# Patient Record
Sex: Male | Born: 1960 | Race: White | Hispanic: No | Marital: Single | Smoking: Current every day smoker
Health system: Southern US, Community
[De-identification: ages and names within clinical notes are randomized; demographics above are authoritative.]

## PROBLEM LIST (undated history)

## (undated) DIAGNOSIS — F0390 Unspecified dementia without behavioral disturbance: Secondary | ICD-10-CM

## (undated) DIAGNOSIS — I639 Cerebral infarction, unspecified: Secondary | ICD-10-CM

## (undated) DIAGNOSIS — K7460 Unspecified cirrhosis of liver: Secondary | ICD-10-CM

## (undated) DIAGNOSIS — B1920 Unspecified viral hepatitis C without hepatic coma: Secondary | ICD-10-CM

## (undated) DIAGNOSIS — S069X9A Unspecified intracranial injury with loss of consciousness of unspecified duration, initial encounter: Secondary | ICD-10-CM

## (undated) DIAGNOSIS — I671 Cerebral aneurysm, nonruptured: Secondary | ICD-10-CM

## (undated) DIAGNOSIS — I219 Acute myocardial infarction, unspecified: Secondary | ICD-10-CM

## (undated) DIAGNOSIS — I509 Heart failure, unspecified: Secondary | ICD-10-CM

## (undated) HISTORY — DX: Acute myocardial infarction, unspecified: I21.9

---

## 2011-10-08 DIAGNOSIS — I639 Cerebral infarction, unspecified: Secondary | ICD-10-CM

## 2011-10-08 DIAGNOSIS — I219 Acute myocardial infarction, unspecified: Secondary | ICD-10-CM

## 2011-10-08 HISTORY — DX: Acute myocardial infarction, unspecified: I21.9

## 2011-10-08 HISTORY — DX: Cerebral infarction, unspecified: I63.9

## 2013-10-07 DIAGNOSIS — S069X9A Unspecified intracranial injury with loss of consciousness of unspecified duration, initial encounter: Secondary | ICD-10-CM

## 2013-10-07 DIAGNOSIS — S069XAA Unspecified intracranial injury with loss of consciousness status unknown, initial encounter: Secondary | ICD-10-CM

## 2013-10-07 HISTORY — DX: Unspecified intracranial injury with loss of consciousness of unspecified duration, initial encounter: S06.9X9A

## 2013-10-07 HISTORY — PX: OTHER SURGICAL HISTORY: SHX169

## 2013-10-07 HISTORY — DX: Unspecified intracranial injury with loss of consciousness status unknown, initial encounter: S06.9XAA

## 2016-01-05 ENCOUNTER — Emergency Department (HOSPITAL_COMMUNITY)
Admission: EM | Admit: 2016-01-05 | Discharge: 2016-01-05 | Disposition: A | Discharge status: Home / Self Care | Payer: Medicaid Other | Attending: Emergency Medicine | Admitting: Emergency Medicine

## 2016-01-05 ENCOUNTER — Emergency Department (HOSPITAL_COMMUNITY): Payer: Medicaid Other

## 2016-01-05 ENCOUNTER — Encounter (HOSPITAL_COMMUNITY): Payer: Self-pay | Admitting: Cardiology

## 2016-01-05 DIAGNOSIS — M952 Other acquired deformity of head: Secondary | ICD-10-CM | POA: Diagnosis not present

## 2016-01-05 DIAGNOSIS — I509 Heart failure, unspecified: Secondary | ICD-10-CM | POA: Diagnosis not present

## 2016-01-05 DIAGNOSIS — R945 Abnormal results of liver function studies: Secondary | ICD-10-CM

## 2016-01-05 DIAGNOSIS — Z79899 Other long term (current) drug therapy: Secondary | ICD-10-CM | POA: Diagnosis not present

## 2016-01-05 DIAGNOSIS — R7989 Other specified abnormal findings of blood chemistry: Secondary | ICD-10-CM | POA: Diagnosis present

## 2016-01-05 DIAGNOSIS — K7460 Unspecified cirrhosis of liver: Secondary | ICD-10-CM | POA: Diagnosis not present

## 2016-01-05 DIAGNOSIS — I639 Cerebral infarction, unspecified: Secondary | ICD-10-CM | POA: Insufficient documentation

## 2016-01-05 HISTORY — DX: Cerebral aneurysm, nonruptured: I67.1

## 2016-01-05 HISTORY — DX: Unspecified cirrhosis of liver: K74.60

## 2016-01-05 HISTORY — DX: Cerebral infarction, unspecified: I63.9

## 2016-01-05 HISTORY — DX: Heart failure, unspecified: I50.9

## 2016-01-05 HISTORY — DX: Unspecified intracranial injury with loss of consciousness of unspecified duration, initial encounter: S06.9X9A

## 2016-01-05 HISTORY — DX: Unspecified dementia, unspecified severity, without behavioral disturbance, psychotic disturbance, mood disturbance, and anxiety: F03.90

## 2016-01-05 HISTORY — DX: Unspecified viral hepatitis C without hepatic coma: B19.20

## 2016-01-05 LAB — CBC WITH DIFFERENTIAL/PLATELET
BASOS PCT: 0 %
Basophils Absolute: 0 10*3/uL (ref 0.0–0.1)
EOS ABS: 0.1 10*3/uL (ref 0.0–0.7)
Eosinophils Relative: 1 %
HEMATOCRIT: 38.6 % — AB (ref 39.0–52.0)
HEMOGLOBIN: 13.7 g/dL (ref 13.0–17.0)
LYMPHS ABS: 1 10*3/uL (ref 0.7–4.0)
Lymphocytes Relative: 29 %
MCH: 33.7 pg (ref 26.0–34.0)
MCHC: 35.5 g/dL (ref 30.0–36.0)
MCV: 94.8 fL (ref 78.0–100.0)
Monocytes Absolute: 0.5 10*3/uL (ref 0.1–1.0)
Monocytes Relative: 16 %
NEUTROS ABS: 1.8 10*3/uL (ref 1.7–7.7)
NEUTROS PCT: 52 %
Platelets: 56 10*3/uL — ABNORMAL LOW (ref 150–400)
RBC: 4.07 MIL/uL — AB (ref 4.22–5.81)
RDW: 14.6 % (ref 11.5–15.5)
WBC: 3.5 10*3/uL — AB (ref 4.0–10.5)

## 2016-01-05 LAB — COMPREHENSIVE METABOLIC PANEL
ALBUMIN: 2.7 g/dL — AB (ref 3.5–5.0)
ALK PHOS: 103 U/L (ref 38–126)
ALT: 34 U/L (ref 17–63)
AST: 76 U/L — ABNORMAL HIGH (ref 15–41)
Anion gap: 7 (ref 5–15)
BUN: 9 mg/dL (ref 6–20)
CALCIUM: 8.9 mg/dL (ref 8.9–10.3)
CO2: 27 mmol/L (ref 22–32)
CREATININE: 0.62 mg/dL (ref 0.61–1.24)
Chloride: 105 mmol/L (ref 101–111)
GFR calc Af Amer: >60 mL/min (ref >60)
GFR calc non Af Amer: >60 mL/min (ref >60)
GLUCOSE: 82 mg/dL (ref 65–99)
Potassium: 3.4 mmol/L — ABNORMAL LOW (ref 3.5–5.1)
SODIUM: 139 mmol/L (ref 135–145)
Total Bilirubin: 2.4 mg/dL — ABNORMAL HIGH (ref 0.3–1.2)
Total Protein: 6.5 g/dL (ref 6.5–8.1)

## 2016-01-05 LAB — I-STAT CG4 LACTIC ACID, ED: Lactic Acid, Venous: 1.52 mmol/L (ref 0.5–2.0)

## 2016-01-05 LAB — URINALYSIS, ROUTINE W REFLEX MICROSCOPIC
Bilirubin Urine: NEGATIVE
GLUCOSE, UA: NEGATIVE mg/dL
HGB URINE DIPSTICK: NEGATIVE
Ketones, ur: NEGATIVE mg/dL
Leukocytes, UA: NEGATIVE
Nitrite: NEGATIVE
PH: 8 (ref 5.0–8.0)
Protein, ur: NEGATIVE mg/dL
SPECIFIC GRAVITY, URINE: 1.005 (ref 1.005–1.030)

## 2016-01-05 LAB — TROPONIN I

## 2016-01-05 LAB — LIPASE, BLOOD: Lipase: 42 U/L (ref 11–51)

## 2016-01-05 LAB — AMMONIA: Ammonia: 45 umol/L — ABNORMAL HIGH (ref 9–35)

## 2016-01-05 MED ORDER — DIPHENHYDRAMINE HCL 50 MG/ML IJ SOLN
25.0000 mg | Freq: Once | INTRAMUSCULAR | Status: AC
  Administered 2016-01-05: 25 mg via INTRAVENOUS
  Filled 2016-01-05: qty 1

## 2016-01-05 MED ORDER — METOCLOPRAMIDE HCL 5 MG/ML IJ SOLN
10.0000 mg | Freq: Once | INTRAMUSCULAR | Status: AC
  Administered 2016-01-05: 10 mg via INTRAVENOUS
  Filled 2016-01-05: qty 2

## 2016-01-05 NOTE — Discharge Instructions (Signed)
Cirrhosis Your ammonia level is 45 which is minimally elevated. Follow with a stomach doctor and continue to take your liver medications. Return to the ED if you develop new or worsening symptoms. Cirrhosis is long-term (chronic) liver injury. The liver is your largest internal organ, and it performs many functions. The liver converts food into energy, removes toxic material from your blood, makes important proteins, and absorbs necessary vitamins from your diet. If you have cirrhosis, it means many of your healthy liver cells have been replaced by scar tissue. This prevents blood from flowing through your liver, which makes it difficult for your liver to function. This scarring is not reversible, but treatment can prevent it from getting worse.  CAUSES  Hepatitis C and long-term alcohol abuse are the most common causes of cirrhosis. Other causes include:  Nonalcoholic fatty liver disease.  Hepatitis B infection.  Autoimmune hepatitis.  Diseases that cause blockage of ducts inside the liver.  Inherited liver diseases.  Reactions to certain long-term medicines.  Parasitic infections.  Long-term exposure to certain toxins. RISK FACTORS You may have a higher risk of cirrhosis if you:  Have certain hepatitis viruses.  Abuse alcohol, especially if you are male.  Are overweight.  Share needles.  Have unprotected sex with someone who has hepatitis. SYMPTOMS  You may not have any signs and symptoms at first. Symptoms may not develop until the damage to your liver starts to get worse. Signs and symptoms of cirrhosis may include:   Tenderness in the right-upper part of your abdomen.  Weakness and tiredness (fatigue).  Loss of appetite.  Nausea.  Weight loss and muscle loss.  Itchiness.  Yellow skin and eyes (jaundice).  Buildup of fluid in the abdomen (ascites).  Swelling of the feet and ankles (edema).  Appearance of tiny blood vessels under the skin.  Mental  confusion.  Easy bruising and bleeding. DIAGNOSIS  Your health care provider may suspect cirrhosis based on your symptoms and medical history, especially if you have other medical conditions or a history of alcohol abuse. Your health care provider will do a physical exam to feel your liver and check for signs of cirrhosis. Your health care provider may perform other tests, including:   Blood tests to check:   Whether you have hepatitis B or C.   Kidney function.  Liver function.  Imaging tests such as:  MRI or CT scan to look for changes seen in advanced cirrhosis.  Ultrasound to see if normal liver tissue is being replaced by scar tissue.  A procedure using a long needle to take a sample of liver tissue (biopsy) for examination under a microscope. Liver biopsy can confirm the diagnosis of cirrhosis.  TREATMENT  Treatment depends on how damaged your liver is and what caused the damage. Treatment may include treating cirrhosis symptoms or treating the underlying causes of the condition to try to slow the progression of the damage. Treatment may include:  Making lifestyle changes, such as:   Eating a healthy diet.  Restricting salt intake.  Maintaining a healthy weight.   Not abusing drugs or alcohol.  Taking medicines to:  Treat liver infections or other infections.  Control itching.  Reduce fluid buildup.  Reduce certain blood toxins.  Reduce risk of bleeding from enlarged blood vessels in the stomach or esophagus (varices).  If varices are causing bleeding problems, you may need treatment with a procedure that ties up the vessels causing them to fall off (band ligation).  If cirrhosis is  causing your liver to fail, your health care provider may recommend a liver transplant.  Other treatments may be recommended depending on any complications of cirrhosis, such as liver-related kidney failure (hepatorenal syndrome). HOME CARE INSTRUCTIONS   Take medicines  only as directed by your health care provider. Do not use drugs that are toxic to your liver. Ask your health care provider before taking any new medicines, including over-the-counter medicines.   Rest as needed.  Eat a well-balanced diet. Ask your health care provider or dietitian for more information.   You may have to follow a low-salt diet or restrict your water intake as directed.  Do not drink alcohol. This is especially important if you are taking acetaminophen.  Keep all follow-up visits as directed by your health care provider. This is important. SEEK MEDICAL CARE IF:  You have fatigue or weakness that is getting worse.  You develop swelling of the hands, feet, legs, or face.  You have a fever.  You develop loss of appetite.  You have nausea or vomiting.  You develop jaundice.  You develop easy bruising or bleeding. SEEK IMMEDIATE MEDICAL CARE IF:  You vomit bright red blood or a material that looks like coffee grounds.  You have blood in your stools.  Your stools appear black and tarry.  You become confused.  You have chest pain or trouble breathing.   This information is not intended to replace advice given to you by your health care provider. Make sure you discuss any questions you have with your health care provider.   Document Released: 09/23/2005 Document Revised: 10/14/2014 Document Reviewed: 06/01/2014 Elsevier Interactive Patient Education [employer]

## 2016-01-05 NOTE — ED Provider Notes (Signed)
CSN: [id number]     Arrival date & time 01/05/16  1012 History  By signing my name below, I, Jared Frye, attest that this documentation has been prepared under the direction and in the presence of Jared OctaveStephen Malachi Suderman, MD. Electronically Signed: Marica OtterNusrat Frye, ED Scribe. 01/05/2016. 10:34 AM.  Chief Complaint  Patient presents with  . Abnormal Lab   HPI PCP: No primary care provider on file. HPI Comments: Jared FitchMichael Frye is a 55 y.o. 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 Pt does not know why his ammonia levels were being checked. Pt also complains of: (1) chronic left knee and left hip pain; and (2) two episodes of vomiting, one last night and one this morning. Pt denies any recent falls or head trauma. Pt denies fever, chills, abd pain, chest pain. Pt denies any chronic cardiac disease or chronic pulmonary problems.   Past Medical History  Diagnosis Date  . Stroke (HCC)   . Cerebral aneurysm   . CHF (congestive heart failure) (HCC)   . Traumatic brain injury (HCC)   . Hepatitis C   . Cirrhosis (HCC)   . Dementia    History reviewed. No pertinent past surgical history. History reviewed. No pertinent family history. Social History  Substance Use Topics  . Smoking status: None  . Smokeless tobacco: None  . Alcohol Use: None    Review of Systems A complete 10 system review of systems was obtained and all systems are negative except as noted in the HPI and PMH.   Allergies  Review of patient's allergies indicates no known allergies.  Home Medications   Prior to Admission medications   Medication Sig Start Date End Date Taking? Authorizing Provider  alclomethasone (ACLOVATE) 0.05 % cream Apply 1 application topically 2 (two) times daily.   Yes Historical Provider, MD  benztropine (COGENTIN) 1 MG tablet Take 1 mg by mouth 2 (two) times  daily.   Yes Historical Provider, MD  Calcium Carbonate-Vitamin D (CALCIUM-D) 600-400 MG-UNIT TABS Take 1 tablet by mouth 2 (two) times daily.   Yes Historical Provider, MD  carvedilol (COREG) 3.125 MG tablet Take 3.125 mg by mouth 2 (two) times daily with a meal.   Yes Historical Provider, MD  desonide (DESOWEN) 0.05 % lotion Apply 1 application topically 2 (two) times daily.   Yes Historical Provider, MD  donepezil (ARICEPT) 10 MG tablet Take 10 mg by mouth at bedtime.   Yes Historical Provider, MD  folic acid (FOLVITE) 1 MG tablet Take 1 mg by mouth daily.   Yes Historical Provider, MD  furosemide (LASIX) 20 MG tablet Take 20 mg by mouth 2 (two) times daily.   Yes Historical Provider, MD  guaifenesin (ROBITUSSIN) 100 MG/5ML syrup Take 200 mg by mouth every 6 (six) hours as needed for cough.   Yes Historical Provider, MD  ipratropium-albuterol (DUONEB) 0.5-2.5 (3) MG/3ML SOLN Take 3 mLs by nebulization every 6 (six) hours as needed (wheezing).   Yes Historical Provider, MD  lactulose (CHRONULAC) 10 GM/15ML solution Take 10 g by mouth 4 (four) times daily.   Yes Historical Provider, MD  lactulose, encephalopathy, (CHRONULAC) 10 GM/15ML SOLN Take 30 g by mouth 4 (four) times daily.   Yes Historical Provider, MD  Melatonin 3 MG TABS Take 1 tablet by mouth at bedtime.   Yes Historical Provider, MD  mirtazapine (REMERON) 30 MG tablet Take  30 mg by mouth at bedtime.   Yes Historical Provider, MD  Multiple Vitamin (MULTIVITAMIN) tablet Take 1 tablet by mouth daily.   Yes Historical Provider, MD  neomycin-bacitracin-polymyxin (NEOSPORIN) ointment Apply 1 application topically daily as needed for wound care. apply to eye   Yes Historical Provider, MD  Pancrelipase, Lip-Prot-Amyl, (CREON) 24000 units CPEP Take 1 capsule by mouth 3 (three) times daily.   Yes Historical Provider, MD  QUEtiapine (SEROQUEL) 25 MG tablet Take 25 mg by mouth 2 (two) times daily.   Yes Historical Provider, MD  selenium sulfide  (SELSUN) 1 % LOTN Apply 1 application topically 3 (three) times a week.   Yes Historical Provider, MD  spironolactone (ALDACTONE) 50 MG tablet Take 50 mg by mouth daily.   Yes Historical Provider, MD   Triage Vitals: BP 99/52 mmHg  Pulse 52  Temp(Src) 98.4 F (36.9 C) (Oral)  Resp 16  Ht 6\' 1"  (1.854 m)  Wt 214 lb (97.07 kg)  BMI 28.24 kg/m2  SpO2 98% Physical Exam  Constitutional: He appears well-developed and well-nourished. No distress.  HENT:  Head: Normocephalic and atraumatic.  Mouth/Throat: Oropharynx is clear and moist. Mucous membranes are dry. No oropharyngeal exudate.  Deformity to right scalp.   Eyes: Conjunctivae and EOM are normal. Pupils are equal, round, and reactive to light.  Neck: Normal range of motion. Neck supple.  No meningismus.  Cardiovascular: Normal rate, regular rhythm, normal heart sounds and intact distal pulses.   No murmur heard. Pulmonary/Chest: Effort normal and breath sounds normal. No respiratory distress.  Abdominal: Soft. There is no tenderness. There is no rebound and no guarding.  Musculoskeletal: Normal range of motion. He exhibits no edema or tenderness.  Neurological: He is alert. No cranial nerve deficit. He exhibits normal muscle tone. Coordination normal.  No ataxia on finger to nose bilaterally. No pronator drift. 4/5 strength ton left and 5/5 strength on right. CN 2-12 intact.Equal grip strength. Sensation intact. Oriented x2 to person and place. Negative asterixis.   Skin: Skin is warm.  Psychiatric: He has a normal mood and affect. His behavior is normal.  Nursing note and vitals reviewed.   ED Course  Procedures (including critical care time) DIAGNOSTIC STUDIES: Oxygen Saturation is 98% on ra, nl by my interpretation.    COORDINATION OF CARE: 10:33 AM: Discussed treatment plan which includes labs, head CT with pt at bedside; patient verbalizes understanding and agrees with treatment plan.  Labs Review Labs Reviewed  CBC  WITH DIFFERENTIAL/PLATELET - Abnormal; Notable for the following:    WBC 3.5 (*)    RBC 4.07 (*)    HCT 38.6 (*)    Platelets 56 (*)    All other components within normal limits  COMPREHENSIVE METABOLIC PANEL - Abnormal; Notable for the following:    Potassium 3.4 (*)    Albumin 2.7 (*)    AST 76 (*)    Total Bilirubin 2.4 (*)    All other components within normal limits  AMMONIA - Abnormal; Notable for the following:    Ammonia 45 (*)    All other components within normal limits  URINALYSIS, ROUTINE W REFLEX MICROSCOPIC (NOT AT [facility])  TROPONIN I  LIPASE, BLOOD  I-STAT CG4 LACTIC ACID, ED    Imaging Review Dg Chest 2 View  01/05/2016  CLINICAL DATA:  Altered mental status with chronic cough and shortness of breath EXAM: CHEST  2 VIEW COMPARISON:  None. FINDINGS: Lungs are clear. Heart size and pulmonary vascularity are normal.  No adenopathy. No bone lesions. No pneumothorax. IMPRESSION: No edema or consolidation. Electronically Signed   By: Bretta Bang III M.D.   On: 01/05/2016 11:14   Ct Head Wo Contrast  01/05/2016  CLINICAL DATA:  55 year old male with altered mental status. Cirrhosis with elevated ammonia levels. Personal history of traumatic brain injury. Initial encounter. EXAM: CT HEAD WITHOUT CONTRAST TECHNIQUE: Contiguous axial images were obtained from the base of the skull through the vertex without intravenous contrast. COMPARISON:  None. FINDINGS: Sequelae of right hemi craniectomy. No acute osseous abnormality identified. Visualized paranasal sinuses and mastoids are clear. No acute orbit or scalp soft tissue findings. Calcified atherosclerosis at the skull base. Right temporal lobe, frontal lobe (especially frontal operculum) and parietal lobe encephalomalacia. Probable areas of associated laminar necrosis. Mild ex vacuo ventricular enlargement. Wallerian degeneration evident at the midbrain on the right. No intracranial mass effect. Basilar cisterns are patent. No  acute intracranial hemorrhage identified. No definite acute cortically based infarct. Left hemisphere and posterior fossa gray-white matter differentiation is normal. No suspicious intracranial vascular hyperdensity. IMPRESSION: 1. No definite acute intracranial abnormality. 2. 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 Electronically Signed   By: Odessa Fleming M.D.   On: 01/05/2016 11:27   US Abdomen Limited Ruq  01/05/2016  CLINICAL DATA:  Elevated liver enzymes. History of hepatitis-C and hepatic cirrhosis EXAM: US ABDOMEN LIMITED - RIGHT UPPER QUADRANT COMPARISON:  None. FINDINGS: Gallbladder: Gallbladder is contracted with wall thickening. There are echogenic foci in the gallbladder which shadow consistent with gallstones. Largest gallstone seen measures 6 mm in length. No pericholecystic fluid. No sonographic Murphy sign noted by sonographer. Common bile duct: Diameter: 3 mm. No intrahepatic or extrahepatic biliary duct dilatation. Liver: No focal lesion identified. Liver has a nodular contour with increased coarsened and somewhat heterogeneous echotexture consistent with cirrhosis. IMPRESSION: Contracted gallbladder with wall thickening and cholelithiasis. A degree of cholecystitis is felt to be present. Liver appearance is consistent with cirrhosis. While no focal liver lesions are identified, it must be cautioned that the sensitivity of ultrasound for focal liver lesions is diminished significantly given these parenchymal liver disease changes. Electronically Signed   By: Bretta Bang III M.D.   On: 01/05/2016 13:14   I have personally reviewed and evaluated these imaging and lab results as part of my medical decision-making.   EKG Interpretation   Date/Time:  Friday January 05 2016 12:52:22 EDT Ventricular Rate:  55 PR Interval:  208 QRS Duration: 109 QT Interval:  486 QTC Calculation: 465 R Axis:   79 Text Interpretation:  Sinus rhythm Borderline  prolonged PR interval No  significant change was found Confirmed by Manus Gunning  MD, Jakwon Gayton [phone]) on  01/05/2016 12:59:52 PM      MDM   Final diagnoses:  Elevated LFTs  Hepatic cirrhosis, unspecified hepatic cirrhosis type [facility])   Patient sent from nursing home with ammonia level of 122. He denies any abdominal pain, nausea or vomiting. No fever. No chest pain or shortness of breath.  Ammonia level on arrival was 45.\  D/w [facility]. Staff states ammonia level was checked because in-house doctor ordered to be checked. Patient with history of cirrhosis and takes lactulose 3-4 times daily.  Labs show minimal bilirubin elevation of 2.7. Thrombocytopenia of 56. There is no evidence of ongoing bleeding. No records in our system. Care everywhere shows some records from [place] which show thrombocytopenia usually in the 60 range. His bilirubin is usually normal.  Ammonia level here  is 45 which is mildly above normal. Patient is fully awake and alert and oriented. He is tolerating by mouth and ambulatory. RUQ shows gallstones without definite cholecystitis.  Discussed with Dr. Lovell Sheehan. He agrees patient needs PCP and GI follow-up rather than urgent cholecystectomy. Unclear what his baseline liver function is. He has no abdominal pain, fever, leukocytosis and does not appear to have cholecystitis at this time.  He is tolerating PO and anxious to be discharged. Return precautions discussed.     I personally performed the services described in this documentation, which was scribed in my presence. The recorded information has been reviewed and is accurate.   Jared Octave, MD 01/05/16 [phone]

## 2016-01-05 NOTE — ED Notes (Signed)
Lab at bedside

## 2016-01-05 NOTE — ED Notes (Signed)
Patient has been ambulating to the restroom on his own.  Patient is able to walk without assistance.

## 2016-01-05 NOTE — ED Notes (Signed)
Pt given meal tray with approval from Dr. Manus Gunningancour.

## 2016-01-05 NOTE — ED Notes (Signed)
[facility] [name] [facility]. They are sending a ride for him.

## 2016-01-05 NOTE — ED Notes (Signed)
From [facility] with ammonia level 122.  Pt alert and oriented.

## 2016-01-05 NOTE — ED Notes (Signed)
Pt given fluids. Currently US is at bedside. Instructed to drink fluids when US complete.

## 2016-01-30 ENCOUNTER — Encounter: Payer: Self-pay | Admitting: Gastroenterology

## 2016-01-30 ENCOUNTER — Ambulatory Visit (INDEPENDENT_AMBULATORY_CARE_PROVIDER_SITE_OTHER): Payer: Medicaid Other | Admitting: Gastroenterology

## 2016-01-30 VITALS — BP 115/72 | HR 62 | Temp 97.5°F | Ht 72.0 in | Wt 218.0 lb

## 2016-01-30 DIAGNOSIS — B1920 Unspecified viral hepatitis C without hepatic coma: Secondary | ICD-10-CM | POA: Diagnosis not present

## 2016-01-30 DIAGNOSIS — K7460 Unspecified cirrhosis of liver: Secondary | ICD-10-CM
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
## 2016-01-30 NOTE — Progress Notes (Signed)
CC'ED TO PCP 

## 2016-01-30 NOTE — Patient Instructions (Signed)
1. Please have the lab work done. 2. Return to the office in 3 months or sooner if needed. 3. If you change your mind regarding having upper endoscopy to screen for esophageal varices and colonoscopy to screen for colon cancer, please let us know.

## 2016-01-30 NOTE — Assessment & Plan Note (Signed)
55 year old gentleman with cirrhosis complicated by possible recent encephalopathy, thrombocytopenia. Positive hepatitis C antibody, RNA unknown. Prior alcohol abuse, quit in 2015. Currently up-to-date on HCC screening. Update labs to calculate MELD and Child Pugh score. Patient is not interested in pursuing EGD for esophageal variceal screening. He understands that he could hemorrhage to death if he has varices that are not treated.  We will plan on labs today. Next ultrasound of the abdomen in 6 months. Come back to the office in 3 months for follow-up. If he changes his mind regarding EGD for esophageal variceal screening and colonoscopy for colon cancer prevention screening he will let us know. Otherwise continue current medication regimen.

## 2016-03-11 ENCOUNTER — Telehealth: Payer: Self-pay | Admitting: Gastroenterology

## 2016-03-11 NOTE — Telephone Encounter (Signed)
Reviewed labs done an outside source dated 02/08/2016  INR 1.47, HCV RNA 3,086,5781,016,675, HCV genotype 1A, white blood cell count 3140, hemoglobin 13.9, hematocrit 38.3, platelets 66,000, sodium 135.6, potassium 3.68, glucose 75.7, BUN 8.4, creatinine 0.73, total bilirubin 1.46, alkaline phosphatase 113.1, AST 76.7, ALT 39, albumin 2.76  MELD Na 15  Keep upcoming appointment. Patient will likely be referred out for HCV treatment due to decompensated cirrhosis. Will discuss patient's wishes at OV.

## 2016-03-14 NOTE — Telephone Encounter (Signed)
I sent to Dr. Darrick PennaFields inadvertently. [facility]

## 2016-03-21 NOTE — Telephone Encounter (Signed)
[facility] and [facility]

## 2016-04-30 ENCOUNTER — Ambulatory Visit: Payer: Medicaid Other | Admitting: Gastroenterology

## 2016-05-01 ENCOUNTER — Encounter: Payer: Self-pay | Admitting: Gastroenterology

## 2016-05-01 ENCOUNTER — Telehealth: Payer: Self-pay

## 2016-05-01 ENCOUNTER — Other Ambulatory Visit: Payer: Self-pay

## 2016-05-01 ENCOUNTER — Ambulatory Visit (INDEPENDENT_AMBULATORY_CARE_PROVIDER_SITE_OTHER): Payer: Medicaid Other | Admitting: Gastroenterology

## 2016-05-01 ENCOUNTER — Telehealth: Payer: Self-pay | Admitting: General Practice

## 2016-05-01 VITALS — BP 104/56 | HR 54 | Temp 97.0°F | Ht 72.0 in | Wt 218.8 lb

## 2016-05-01 DIAGNOSIS — K5900 Constipation, unspecified: Secondary | ICD-10-CM | POA: Insufficient documentation

## 2016-05-01 DIAGNOSIS — B182 Chronic viral hepatitis C: Secondary | ICD-10-CM | POA: Diagnosis not present

## 2016-05-01 DIAGNOSIS — I8500 Esophageal varices without bleeding: Secondary | ICD-10-CM

## 2016-05-01 DIAGNOSIS — K7460 Unspecified cirrhosis of liver: Secondary | ICD-10-CM

## 2016-05-01 MED ORDER — RIFAXIMIN 550 MG PO TABS: 550.0000 mg | ORAL_TABLET | Freq: Two times a day (BID) | ORAL | 11 refills | Status: AC

## 2016-05-01 MED ORDER — LINACLOTIDE 145 MCG PO CAPS: 145.0000 ug | ORAL_CAPSULE | Freq: Every day | ORAL | 11 refills | Status: DC
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
## 2016-05-01 NOTE — Patient Instructions (Addendum)
1. Start Linzess on empty stomach daily for constipation.  2. Start Xifaxan 550mg  twice daily to decrease the ammonia level from the cirrhosis. If your insurance will pay for this, this will take the place of your lactulose.  3. Discontinue lactulose, IF patient is able to start Xifaxan 500mg  twice a day per hepatic encephalopathy.  4. Upper endoscopy with Dr. Darrick Penna to look for esophageal varices. 5. Referral to the hepatitis C specialist in [facility] for consideration of treatment.  6. Return to the office in 3 months to see Dr. Darrick Penna. You will be due for labs and abdominal ultrasound at that time.

## 2016-05-01 NOTE — Telephone Encounter (Signed)
Nursing home called to change his EGD from 05/07/16 to 05/13/16.

## 2016-05-01 NOTE — Telephone Encounter (Signed)
I received a call from [place] with [facility] in regards to changing Mr Faupel' pre-admission appointment from 8/3 to 8/4. I left a message on Carolyn's phone in Endo regarding the change.

## 2016-05-02 ENCOUNTER — Encounter: Payer: Self-pay | Admitting: Gastroenterology

## 2016-05-02 NOTE — Assessment & Plan Note (Addendum)
Constipation. Noncompliance with lactulose given cramping, nausea. Switch to Linzess daily. Provide Xifaxan 550mg  BID for hepatic encephalopathy prevention. D/c lactulose.   Patient has never had a colonoscopy and refuses.

## 2016-05-02 NOTE — Assessment & Plan Note (Signed)
Currently up to date on hepatoma screening and labs. Will order hepatitis B vaccination given lack of immunity. Plan for EGD with deep sedation to screen for varices.  I have discussed the risks, alternatives, benefits with regards to but not limited to the risk of reaction to medication, bleeding, infection, perforation and the patient is agreeable to proceed. Written consent to be obtained.

## 2016-05-02 NOTE — Telephone Encounter (Signed)
I gave Jared Frye at [facility] Mr Slinker's new pre-op appointment 05/10/16 at 12:45 at [facility]

## 2016-05-02 NOTE — Assessment & Plan Note (Signed)
Would recommend referral for evaluation for HCV treatment at specialty clinic in [place] given complicated co-morbidities, decompensated disease. Patient in agreement. He is still in need of Hep B vaccination.

## 2016-05-03 ENCOUNTER — Other Ambulatory Visit (HOSPITAL_COMMUNITY): Payer: Medicaid Other

## 2016-05-03 NOTE — Progress Notes (Signed)
CC'ED TO PCP 

## 2016-05-07 NOTE — Patient Instructions (Signed)
Jared Frye  05/07/2016     @PREFPERIOPPHARMACY @   Your procedure is scheduled on 05/14/2016.  Report to [facility] at 6:15 A.M.  Call this number if you have problems the morning of surgery:  [phone]   Remember:  Do not eat food or drink liquids after midnight.  Take these medicines the morning of surgery with A SIP OF WATER Cogentin, Coreg, Aricept, Duoneb, Linzess, Oxy IR if needed, Seroquel, Xifaxan, Remeron   Do not wear jewelry, make-up or nail polish.  Do not wear lotions, powders, or perfumes.  You may wear deoderant.  Do not shave 48 hours prior to surgery.  Men may shave face and neck.  Do not bring valuables to the hospital.  [facility] is not responsible for any belongings or valuables.  Contacts, dentures or bridgework may not be worn into surgery.  Leave your suitcase in the car.  After surgery it may be brought to your room.  For patients admitted to the hospital, discharge time will be determined by your treatment team.  Patients discharged the day of surgery will not be allowed to drive home.    Please read over the following fact sheets that you were given. Anesthesia Post-op Instructions     PATIENT INSTRUCTIONS POST-ANESTHESIA  IMMEDIATELY FOLLOWING SURGERY:  Do not drive or operate machinery for the first twenty four hours after surgery.  Do not make any important decisions for twenty four hours after surgery or while taking narcotic pain medications or sedatives.  If you develop intractable nausea and vomiting or a severe headache please notify your doctor immediately.  FOLLOW-UP:  Please make an appointment with your surgeon as instructed. You do not need to follow up with anesthesia unless specifically instructed to do so.  WOUND CARE INSTRUCTIONS (if applicable):  Keep a dry clean dressing on the anesthesia/puncture wound site if there is drainage.  Once the wound has quit draining you may leave it open to air.  Generally you should leave the  bandage intact for twenty four hours unless there is drainage.  If the epidural site drains for more than 36-48 hours please call the anesthesia department.  QUESTIONS?:  Please feel free to call your physician or the hospital operator if you have any questions, and they will be happy to assist you.      Esophagogastroduodenoscopy Esophagogastroduodenoscopy (EGD) is a procedure that is used to examine the lining of the esophagus, stomach, and first part of the small intestine (duodenum). A long, flexible, lighted tube with a camera attached (endoscope) is inserted down the throat to view these organs. This procedure is done to detect problems or abnormalities, such as inflammation, bleeding, ulcers, or growths, in order to treat them. The procedure lasts 5-20 minutes. It is usually an outpatient procedure, but it may need to be performed in a hospital in emergency cases. LET [facility] CARE PROVIDER KNOW ABOUT:  Any allergies you have.  All medicines you are taking, including vitamins, herbs, eye drops, creams, and over-the-counter medicines.  Previous problems you or members of your family have had with the use of anesthetics.  Any blood disorders you have.  Previous surgeries you have had.  Medical conditions you have. RISKS AND COMPLICATIONS Generally, this is a safe procedure. However, problems can occur and include:  Infection.  Bleeding.  Tearing (perforation) of the esophagus, stomach, or duodenum.  Difficulty breathing or not being able to breathe.  Excessive sweating.  Spasms of the larynx.  Slowed heartbeat.  Low blood pressure. BEFORE THE PROCEDURE  Do not eat or drink anything after midnight on the night before the procedure or as directed by your health care provider.  Do not take your regular medicines before the procedure if your health care provider asks you not to. Ask your health care provider about changing or stopping those medicines.  If you wear  dentures, be prepared to remove them before the procedure.  Arrange for someone to drive you home after the procedure. PROCEDURE  A numbing medicine (local anesthetic) may be sprayed in your throat for comfort and to stop you from gagging or coughing.  You will have an IV tube inserted in a vein in your hand or arm. You will receive medicines and fluids through this tube.  You will be given a medicine to relax you (sedative).  A pain reliever will be given through the IV tube.  A mouth guard may be placed in your mouth to protect your teeth and to keep you from biting on the endoscope.  You will be asked to lie on your left side.  The endoscope will be inserted down your throat and into your esophagus, stomach, and duodenum.  Air will be put through the endoscope to allow your health care provider to clearly view the lining of your esophagus.  The lining of your esophagus, stomach, and duodenum will be examined. During the exam, your health care provider may:  Remove tissue to be examined under a microscope (biopsy) for inflammation, infection, or other medical problems.  Remove growths.  Remove objects (foreign bodies) that are stuck.  Treat any bleeding with medicines or other devices that stop tissues from bleeding (hot cautery, clipping devices).  Widen (dilate) or stretch narrowed areas of your esophagus and stomach.  The endoscope will be withdrawn. AFTER THE PROCEDURE  You will be taken to a recovery area for observation. Your blood pressure, heart rate, breathing rate, and blood oxygen level will be monitored often until the medicines you were given have worn off.  Do not eat or drink anything until the numbing medicine has worn off and your gag reflex has returned. You may choke.  Your health care provider should be able to discuss his or her findings with you. It will take longer to discuss the test results if any biopsies were taken.   This information is not  intended to replace advice given to you by your health care provider. Make sure you discuss any questions you have with your health care provider.   Document Released: 01/24/2005 Document Revised: 10/14/2014 Document Reviewed: 08/26/2012 Elsevier Interactive Patient Education [employer].

## 2016-05-09 ENCOUNTER — Other Ambulatory Visit (HOSPITAL_COMMUNITY): Payer: Medicaid Other

## 2016-05-10 ENCOUNTER — Encounter (HOSPITAL_COMMUNITY)
Admission: RE | Admit: 2016-05-10 | Discharge: 2016-05-10 | Disposition: A | Discharge status: Home / Self Care | Payer: Medicaid Other | Source: Ambulatory Visit | Attending: Gastroenterology | Admitting: Gastroenterology

## 2016-05-10 ENCOUNTER — Encounter (HOSPITAL_COMMUNITY): Payer: Self-pay

## 2016-05-10 DIAGNOSIS — Z01812 Encounter for preprocedural laboratory examination: Secondary | ICD-10-CM | POA: Diagnosis present

## 2016-05-10 LAB — BASIC METABOLIC PANEL WITH GFR
Anion gap: 5 (ref 5–15)
BUN: 10 mg/dL (ref 6–20)
CO2: 25 mmol/L (ref 22–32)
Calcium: 8.8 mg/dL — ABNORMAL LOW (ref 8.9–10.3)
Chloride: 106 mmol/L (ref 101–111)
Creatinine, Ser: 0.7 mg/dL (ref 0.61–1.24)
GFR calc Af Amer: >60 mL/min
GFR calc non Af Amer: >60 mL/min
Glucose, Bld: 107 mg/dL — ABNORMAL HIGH (ref 65–99)
Potassium: 3.9 mmol/L (ref 3.5–5.1)
Sodium: 136 mmol/L (ref 135–145)

## 2016-05-10 LAB — CBC
HCT: 39.9 % (ref 39.0–52.0)
Hemoglobin: 14 g/dL (ref 13.0–17.0)
MCH: 33.8 pg (ref 26.0–34.0)
MCHC: 35.1 g/dL (ref 30.0–36.0)
MCV: 96.4 fL (ref 78.0–100.0)
Platelets: 45 K/uL — ABNORMAL LOW (ref 150–400)
RBC: 4.14 MIL/uL — ABNORMAL LOW (ref 4.22–5.81)
RDW: 15.1 % (ref 11.5–15.5)
WBC: 4 K/uL (ref 4.0–10.5)

## 2016-05-13 ENCOUNTER — Telehealth: Payer: Self-pay

## 2016-05-13 NOTE — Telephone Encounter (Signed)
Family call this morning and said that they was not able to go to the hospital in morning due to transpiration but they wanted us to have there phone number. [phone](Mr[place]man [email or web address]. [place])  [phone])  [phone]. They would like for the father to be called after the EGD tommorrow

## 2016-05-14 ENCOUNTER — Ambulatory Visit (HOSPITAL_COMMUNITY): Payer: Medicaid Other | Admitting: Anesthesiology

## 2016-05-14 ENCOUNTER — Encounter (HOSPITAL_COMMUNITY)
Admission: RE | Disposition: A | Discharge status: Skilled Nursing Facility | Payer: Self-pay | Source: Ambulatory Visit | Attending: Gastroenterology

## 2016-05-14 ENCOUNTER — Ambulatory Visit (HOSPITAL_COMMUNITY)
Admission: RE | Admit: 2016-05-14 | Discharge: 2016-05-14 | Disposition: A | Discharge status: Skilled Nursing Facility | Payer: Medicaid Other | Source: Ambulatory Visit | Attending: Gastroenterology | Admitting: Gastroenterology

## 2016-05-14 DIAGNOSIS — K7460 Unspecified cirrhosis of liver: Secondary | ICD-10-CM | POA: Insufficient documentation

## 2016-05-14 DIAGNOSIS — I252 Old myocardial infarction: Secondary | ICD-10-CM | POA: Insufficient documentation

## 2016-05-14 DIAGNOSIS — I739 Peripheral vascular disease, unspecified: Secondary | ICD-10-CM | POA: Insufficient documentation

## 2016-05-14 DIAGNOSIS — Z7982 Long term (current) use of aspirin: Secondary | ICD-10-CM | POA: Diagnosis not present

## 2016-05-14 DIAGNOSIS — Z79899 Other long term (current) drug therapy: Secondary | ICD-10-CM | POA: Insufficient documentation

## 2016-05-14 DIAGNOSIS — I2510 Atherosclerotic heart disease of native coronary artery without angina pectoris: Secondary | ICD-10-CM | POA: Insufficient documentation

## 2016-05-14 DIAGNOSIS — F17200 Nicotine dependence, unspecified, uncomplicated: Secondary | ICD-10-CM | POA: Insufficient documentation

## 2016-05-14 DIAGNOSIS — K766 Portal hypertension: Secondary | ICD-10-CM | POA: Insufficient documentation

## 2016-05-14 DIAGNOSIS — K269 Duodenal ulcer, unspecified as acute or chronic, without hemorrhage or perforation: Secondary | ICD-10-CM | POA: Insufficient documentation

## 2016-05-14 DIAGNOSIS — I509 Heart failure, unspecified: Secondary | ICD-10-CM | POA: Diagnosis not present

## 2016-05-14 DIAGNOSIS — K759 Inflammatory liver disease, unspecified: Secondary | ICD-10-CM

## 2016-05-14 DIAGNOSIS — K2970 Gastritis, unspecified, without bleeding: Secondary | ICD-10-CM | POA: Diagnosis not present

## 2016-05-14 DIAGNOSIS — K2950 Unspecified chronic gastritis without bleeding: Secondary | ICD-10-CM | POA: Diagnosis not present

## 2016-05-14 DIAGNOSIS — B1920 Unspecified viral hepatitis C without hepatic coma: Secondary | ICD-10-CM | POA: Diagnosis not present

## 2016-05-14 DIAGNOSIS — I69354 Hemiplegia and hemiparesis following cerebral infarction affecting left non-dominant side: Secondary | ICD-10-CM | POA: Diagnosis not present

## 2016-05-14 DIAGNOSIS — K3189 Other diseases of stomach and duodenum: Secondary | ICD-10-CM | POA: Diagnosis not present

## 2016-05-14 DIAGNOSIS — F0390 Unspecified dementia without behavioral disturbance: Secondary | ICD-10-CM | POA: Insufficient documentation

## 2016-05-14 HISTORY — PX: BIOPSY: SHX5522

## 2016-05-14 HISTORY — PX: ESOPHAGOGASTRODUODENOSCOPY (EGD) WITH PROPOFOL: SHX5813

## 2016-05-14 LAB — COMPREHENSIVE METABOLIC PANEL
ALBUMIN: 2.5 g/dL — AB (ref 3.5–5.0)
ALK PHOS: 90 U/L (ref 38–126)
ALT: 29 U/L (ref 17–63)
ANION GAP: 5 (ref 5–15)
AST: 56 U/L — ABNORMAL HIGH (ref 15–41)
BUN: 9 mg/dL (ref 6–20)
CALCIUM: 7.9 mg/dL — AB (ref 8.9–10.3)
CHLORIDE: 103 mmol/L (ref 101–111)
CO2: 28 mmol/L (ref 22–32)
CREATININE: 0.71 mg/dL (ref 0.61–1.24)
GFR calc Af Amer: >60 mL/min (ref >60)
GFR calc non Af Amer: >60 mL/min (ref >60)
GLUCOSE: 84 mg/dL (ref 65–99)
Potassium: 4 mmol/L (ref 3.5–5.1)
SODIUM: 136 mmol/L (ref 135–145)
Total Bilirubin: 2.2 mg/dL — ABNORMAL HIGH (ref 0.3–1.2)
Total Protein: 5.7 g/dL — ABNORMAL LOW (ref 6.5–8.1)

## 2016-05-14 LAB — PROTIME-INR
INR: 1.46
Prothrombin Time: 17.8 seconds — ABNORMAL HIGH (ref 11.4–15.2)

## 2016-05-14 SURGERY — ESOPHAGOGASTRODUODENOSCOPY (EGD) WITH PROPOFOL
Anesthesia: Monitor Anesthesia Care

## 2016-05-14 MED ORDER — MIDAZOLAM HCL 2 MG/2ML IJ SOLN
1.0000 mg | INTRAMUSCULAR | Status: DC | PRN
  Administered 2016-05-14: 2 mg via INTRAVENOUS

## 2016-05-14 MED ORDER — ONDANSETRON HCL 4 MG/2ML IJ SOLN
4.0000 mg | Freq: Once | INTRAMUSCULAR | Status: AC
  Administered 2016-05-14: 4 mg via INTRAVENOUS

## 2016-05-14 MED ORDER — PROPOFOL 10 MG/ML IV BOLUS
INTRAVENOUS | Status: AC
  Filled 2016-05-14: qty 40

## 2016-05-14 MED ORDER — OMEPRAZOLE 20 MG PO CPDR: DELAYED_RELEASE_CAPSULE | ORAL | 11 refills | Status: AC

## 2016-05-14 MED ORDER — FENTANYL CITRATE (PF) 100 MCG/2ML IJ SOLN
25.0000 ug | INTRAMUSCULAR | Status: DC | PRN
  Administered 2016-05-14: 25 ug via INTRAVENOUS

## 2016-05-14 MED ORDER — ONDANSETRON HCL 4 MG/2ML IJ SOLN: 4.0000 mg | Freq: Once | INTRAMUSCULAR | Status: DC | PRN

## 2016-05-14 MED ORDER — LACTULOSE ENCEPHALOPATHY 10 GM/15ML PO SOLN: 30.0000 g | Freq: Four times a day (QID) | ORAL | 0 refills | Status: AC | PRN

## 2016-05-14 MED ORDER — PROPOFOL 500 MG/50ML IV EMUL
INTRAVENOUS | Status: DC | PRN
  Administered 2016-05-14: 100 ug/kg/min via INTRAVENOUS

## 2016-05-14 MED ORDER — MIDAZOLAM HCL 2 MG/2ML IJ SOLN
INTRAMUSCULAR | Status: AC
  Filled 2016-05-14: qty 2

## 2016-05-14 MED ORDER — FENTANYL CITRATE (PF) 100 MCG/2ML IJ SOLN
INTRAMUSCULAR | Status: AC
  Filled 2016-05-14: qty 2

## 2016-05-14 MED ORDER — FENTANYL CITRATE (PF) 100 MCG/2ML IJ SOLN: 25.0000 ug | INTRAMUSCULAR | Status: DC | PRN

## 2016-05-14 MED ORDER — STERILE WATER FOR IRRIGATION IR SOLN
Status: DC | PRN
  Administered 2016-05-14: 08:00:00

## 2016-05-14 MED ORDER — LIDOCAINE VISCOUS 2 % MT SOLN
OROMUCOSAL | Status: AC
  Filled 2016-05-14: qty 15

## 2016-05-14 MED ORDER — LACTATED RINGERS IV SOLN
INTRAVENOUS | Status: DC
  Administered 2016-05-14: 07:00:00 via INTRAVENOUS

## 2016-05-14 MED ORDER — LIDOCAINE VISCOUS 2 % MT SOLN
OROMUCOSAL | Status: DC | PRN
  Administered 2016-05-14: 6 mL via OROMUCOSAL

## 2016-05-14 MED ORDER — ONDANSETRON HCL 4 MG/2ML IJ SOLN
INTRAMUSCULAR | Status: AC
  Filled 2016-05-14: qty 2

## 2016-05-14 MED ORDER — MIDAZOLAM HCL 5 MG/5ML IJ SOLN
INTRAMUSCULAR | Status: DC | PRN
  Administered 2016-05-14: 2 mg via INTRAVENOUS

## 2016-05-14 NOTE — Brief Op Note (Signed)
05/14/2016  9:03 AM  PATIENT:  Jared Frye  55 y.o. male  PRE-OPERATIVE DIAGNOSIS:  SCREENING FOR VARICES  POST-OPERATIVE DIAGNOSIS:  portal hypertensive gastropathy, duodenal ulcer  PROCEDURE:  Procedure(s) with comments: ESOPHAGOGASTRODUODENOSCOPY (EGD) WITH PROPOFOL (N/A) - 815 - moved to 8/8 @ 7:30 BIOPSY - gastric bx's  SURGEON:  Surgeon(s) and Role:    * West BaliSandi L Leanore Biggers, MD - Primary  SEE AFTER VISIT SUMMARY FOR ADDITIONAL DETAILS AND RECOMMENDATIONS

## 2016-05-14 NOTE — Telephone Encounter (Signed)
CALLED PT'S FATHER. LVM TO CALL TO DISCUSS [phone].
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
## 2016-05-14 NOTE — Transfer of Care (Signed)
Immediate Anesthesia Transfer of Care Note  Patient: Jared Frye  Procedure(s) Performed: Procedure(s) with comments: ESOPHAGOGASTRODUODENOSCOPY (EGD) WITH PROPOFOL (N/A) - 815 - moved to 8/8 @ 7:30 BIOPSY - gastric bx's  Patient Location: PACU  Anesthesia Type:MAC  Level of Consciousness: sedated  Airway & Oxygen Therapy: Patient Spontanous Breathing and Patient connected to face mask oxygen  Post-op Assessment: Post -op Vital signs reviewed and stable  Post vital signs: Reviewed and stable  Last Vitals:  Vitals:   05/14/16 0715 05/14/16 0720  BP: 116/63   Resp: 16 16  Temp:      Last Pain:  Vitals:   05/14/16 0657  TempSrc: Oral  PainSc:       Patients Stated Pain Goal: 7 (05/14/16 0631)  Complications: No apparent anesthesia complications

## 2016-05-14 NOTE — Discharge Instructions (Signed)
YOU DO NOT HAVE ESOPHAGEAL VARICES. You have GASTRITIS AND SMALL ulcers IN THE FIRST PART OF YOUR SMALL INTESTINES. I biopsied your stomach.   NO ASPIRIN, BC/GOODY POWDERS, IBUPROFEN/MOTRIN, OR NAPROXEN/ALEVE DUE TO LIVER DISEASE AND LOW PLATELETS.  CONTINUE OMEPRAZOLE.  TAKE 30 MINUTES PRIOR TO YOUR MEALS TWICE DAILY FOR 3 MOS THEN ONCE DAILY FOREVER.  AVOID TRIGGERS FOR ulcers. SEE INFO BELOW.  FOLLOW UP IN DEC 2017.  REPEAT EGD 2-3 YEARS.  UPPER ENDOSCOPY AFTER CARE Read the instructions outlined below and refer to this sheet in the next week. These discharge instructions provide you with general information on caring for yourself after you leave the hospital. While your treatment has been planned according to the most current medical practices available, unavoidable complications occasionally occur. If you have any problems or questions after discharge, call DR. Jex Strausbaugh, [phone].  ACTIVITY  You may resume your regular activity, but move at a slower pace for the next 24 hours.   Take frequent rest periods for the next 24 hours.   Walking will help get rid of the air and reduce the bloated feeling in your belly (abdomen).   No driving for 24 hours (because of the medicine (anesthesia) used during the test).   You may shower.   Do not sign any important legal documents or operate any machinery for 24 hours (because of the anesthesia used during the test).    NUTRITION  Drink plenty of fluids.   You may resume your normal diet as instructed by your doctor.   Begin with a light meal and progress to your normal diet. Heavy or fried foods are harder to digest and may make you feel sick to your stomach (nauseated).   Avoid alcoholic beverages for 24 hours or as instructed.    MEDICATIONS  You may resume your normal medications.   WHAT YOU CAN EXPECT TODAY  Some feelings of bloating in the abdomen.   Passage of more gas than usual.    IF YOU HAD A BIOPSY TAKEN  DURING THE UPPER ENDOSCOPY:  Eat a soft diet IF YOU HAVE NAUSEA, BLOATING, ABDOMINAL PAIN, OR VOMITING.    FINDING OUT THE RESULTS OF YOUR TEST Not all test results are available during your visit. DR. Darrick PennaFIELDS WILL CALL YOU WITHIN 14 DAYS OF YOUR PROCEDUE WITH YOUR RESULTS. Do not assume everything is normal if you have not heard from DR. Bradleigh Sonnen, CALL HER OFFICE AT [phone].  SEEK IMMEDIATE MEDICAL ATTENTION AND CALL THE OFFICE: [phone] IF:  You have more than a spotting of blood in your stool.   Your belly is swollen (abdominal distention).   You are nauseated or vomiting.   You have a temperature over 101F.   You have abdominal pain or discomfort that is severe or gets worse throughout the day.   Ulcer Disease (Peptic Ulcer, Gastric Ulcer, Duodenal Ulcer) You have an ulcer. This may be in your stomach (gastric ulcer) or in the first part of your small bowel, the duodenum (duodenal ulcer). An ulcer is a break in the lining of the stomach or duodenum. The ulcer causes erosion into the deeper tissue.  CAUSES   The stomach has a lining to protect itself from the acid that digests food. The lining can be damaged in two main ways:  The Helico Pylori bacteria (H. Pyolori) can infect the lining of the stomach and cause ulcers.   ASPIRIN AND Nonsteroidal, anti-inflammatory medications (NSAIDS) can cause gastric ulcerations.   Smoking tobacco can increase  the acid in the stomach. This can lead to ulcers, and will impair healing of ulcers.   Other factors, such as alcohol use and stress may contribute to ulcer formation. Rarely, a tumor or cancer can cause an ulcer.   SYMPTOMS The problems (symptoms) of ulcer disease are usually a burning or gnawing of the mid-upper belly (abdomen). This is often worse on an empty stomach and may get better with food. This may be associated with feeling sick to your stomach (nausea), bloating, and vomiting. If the ulcer results in bleeding, it  can cause:  Black, tarry stools.   Vomiting of bright red blood.   Vomiting coffee-ground-looking materials.   SEVERE ANEMIA  HOME CARE INSTRUCTIONS Continue regular work and usual activities unless advised otherwise by your caregiver.  Avoid tobacco, alcohol, and caffeine. Tobacco use will decrease and slow the rates of healing.   Avoid foods that seem to aggravate or cause discomfort.   There are many over-the-counter products available to control stomach acid and other symptoms.   Special diets are not usually needed.   Keep any follow-up appointments and blood tests, as directed.

## 2016-05-14 NOTE — Anesthesia Postprocedure Evaluation (Signed)
Anesthesia Post Note  Patient: Jared FitchMichael Frye  Procedure(s) Performed: Procedure(s) (LRB): ESOPHAGOGASTRODUODENOSCOPY (EGD) WITH PROPOFOL (N/A) BIOPSY  Patient location during evaluation: PACU Anesthesia Type: MAC Level of consciousness: awake and patient cooperative Pain management: pain level controlled Vital Signs Assessment: post-procedure vital signs reviewed and stable Respiratory status: nonlabored ventilation, spontaneous breathing and respiratory function stable Cardiovascular status: blood pressure returned to baseline and stable Postop Assessment: no signs of nausea or vomiting Anesthetic complications: no    Last Vitals:  Vitals:   05/14/16 0720 05/14/16 0805  BP:    Resp: 16 16  Temp:      Last Pain:  Vitals:   05/14/16 0805  TempSrc:   PainSc: Asleep                 Bayley Hurn J

## 2016-05-14 NOTE — Op Note (Signed)
[facility] Patient Name: Jared Frye Procedure Date: 05/14/2016 7:13 AM MRN: [id number] Date of Birth: May 18, 1961 Attending MD: Jonette Eva , MD CSN: [id number] Age: 55 Admit Type: Outpatient Procedure:                Upper GI endoscopy WITH COLD FORCEPS BIOPSY Indications:              Cirrhosis/Hepatitis C-SCREEN FOR esophageal varices Providers:                Jonette Eva, MD, Edrick Kins, RN, Burke Keels,                            Technician Referring MD:              Medicines:                Propofol per Anesthesia Complications:            No immediate complications. Estimated Blood Loss:     Estimated blood loss was minimal. Procedure:                Pre-Anesthesia Assessment:                           - Prior to the procedure, a History and Physical                            was performed, and patient medications and                            allergies were reviewed. The patient's tolerance of                            previous anesthesia was also reviewed. The risks                            and benefits of the procedure and the sedation                            options and risks were discussed with the patient.                            All questions were answered, and informed consent                            was obtained. Prior Anticoagulants: The patient has                            taken aspirin, last dose was day of procedure. ASA                            Grade Assessment: II - A patient with mild systemic                            disease. After reviewing the risks and benefits,  the patient was deemed in satisfactory condition to                            undergo the procedure. After obtaining informed                            consent, the endoscope was passed under direct                            vision. Throughout the procedure, the patient's                            blood pressure, pulse, and oxygen  saturations were                            monitored continuously. The [id number] ([id number]                            scope was introduced through the mouth, and                            advanced to the second part of duodenum. The upper                            GI endoscopy was accomplished without difficulty.                            The patient tolerated the procedure well. Scope In: 7:47:08 AM Scope Out: 7:56:28 AM Total Procedure Duration: 0 hours 9 minutes 20 seconds  Findings:      The examined esophagus was normal.      Moderate portal hypertensive gastropathy was found in the cardia, in the       gastric fundus and in the gastric body.      Scattered mild inflammation was found in the gastric antrum. Biopsies       were taken with a cold forceps for Helicobacter pylori testing.      Few non-bleeding cratered duodenal ulcers with no stigmata of bleeding       were found in the duodenal bulb and in the first portion of the       duodenum. The largest lesion was 1 mm in largest dimension. Impression:               - Normal esophagus. NO ESOPHAGEAL, GASTRIC, OR                            DUODENAL VARICES                           - Portal hypertensive gastropathy.                           - Gastritis. Biopsied.                           - Multiple non-bleeding duodenal ulcers with no  stigmata of bleeding. Moderate Sedation:      Per Anesthesia Care Recommendation:           - Await pathology results.                           - Resume previous diet.                           - Continue present medications.                           - Return to my office in 6 months.                           - Repeat upper endoscopy in 2-3 years for                            surveillance.                           -CMP/PT/INR/AFP TODAY                           NO ASPIRIN, BC/GOODY POWDERS, IBUPROFEN/MOTRIN, OR                            NAPROXEN/ALEVE DUE TO  LIVER DISEASE AND LOW                            PLATELETS.                           START OMEPRAZOLE. TAKE 30 MINUTES PRIOR TO YOUR                            MEALS TWICE DAILY FOR 3 MOS THEN ONCE DAILY FOREVER.                           AVOID TRIGGERS FOR ulcers.                           FOLLOW UP IN DEC 2017.                           - Patient has a contact number available for                            emergencies. The signs and symptoms of potential                            delayed complications were discussed with the                            patient. Return to normal activities tomorrow.  Written discharge instructions were provided to the                            patient. Procedure Code(s):        --- Professional ---                           [phone], Esophagogastroduodenoscopy, flexible,                            transoral; with biopsy, single or multiple Diagnosis Code(s):        --- Professional ---                           K76.6, Portal hypertension                           K31.89, Other diseases of stomach and duodenum                           K29.70, Gastritis, unspecified, without bleeding                           K26.9, Duodenal ulcer, unspecified as acute or                            chronic, without hemorrhage or perforation                           K74.60, Unspecified cirrhosis of liver                           K75.9, Inflammatory liver disease, unspecified CPT copyright 2016 American Medical Association. All rights reserved. The codes documented in this report are preliminary and upon coder review may  be revised to meet current compliance requirements. Jonette Eva, MD Jonette Eva, MD 05/14/2016 9:47:59 AM This report has been signed electronically. Number of Addenda: 0

## 2016-05-14 NOTE — Anesthesia Preprocedure Evaluation (Signed)
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, Residual Symptoms    GI/Hepatic (+) Cirrhosis       , Hepatitis -, C  Endo/Other    Renal/GU      Musculoskeletal   Abdominal   Peds  Hematology  (+) Blood dyscrasia (thrombocytopenia), ,   Anesthesia Other Findings   Reproductive/Obstetrics                             Anesthesia Physical Anesthesia Plan  ASA: IV  Anesthesia Plan: MAC   Post-op Pain Management:    Induction: Intravenous  Airway Management Planned: Simple Face Mask  Additional Equipment:   Intra-op Plan:   Post-operative Plan:   Informed Consent: I have reviewed the patients History and Physical, chart, labs and discussed the procedure including the risks, benefits and alternatives for the proposed anesthesia with the patient or authorized representative who has indicated his/her understanding and acceptance.     Plan Discussed with:   Anesthesia Plan Comments:         Anesthesia Quick Evaluation

## 2016-05-14 NOTE — Telephone Encounter (Signed)
REVIEWED.  

## 2016-05-15 LAB — AFP TUMOR MARKER: AFP-Tumor Marker: 7.5 ng/mL (ref 0.0–8.3)

## 2016-05-16 ENCOUNTER — Telehealth: Payer: Self-pay | Admitting: Gastroenterology

## 2016-05-16 ENCOUNTER — Encounter: Payer: Self-pay | Admitting: Gastroenterology

## 2016-05-16 NOTE — Telephone Encounter (Signed)
APPT MADE AND LETTER SENT  °

## 2016-05-16 NOTE — Telephone Encounter (Signed)
I called and informed pt's med tech at [facility], [place]. I am also faxing the note to her for the records. ( fax # [phone]).

## 2016-05-16 NOTE — Telephone Encounter (Signed)
Please call pt. His stomach Bx shows gastritis.    NO ASPIRIN, BC/GOODY POWDERS, IBUPROFEN/MOTRIN, OR NAPROXEN/ALEVE DUE TO LIVER DISEASE AND LOW PLATELETS.  CONTINUE OMEPRAZOLE.  TAKE 30 MINUTES PRIOR TO YOUR MEALS TWICE DAILY FOR 3 MOS THEN ONCE DAILY FOREVER.  AVOID TRIGGERS FOR ulcers.   FOLLOW UP IN DEC 2017 E30 CIRRHOSIS.  REPEAT EGD 2 YEARS.

## 2016-05-22 ENCOUNTER — Encounter (HOSPITAL_COMMUNITY): Payer: Self-pay | Admitting: Gastroenterology

## 2016-06-06 ENCOUNTER — Encounter: Payer: Self-pay | Admitting: Gastroenterology

## 2016-06-06 ENCOUNTER — Other Ambulatory Visit: Payer: Self-pay

## 2016-06-06 ENCOUNTER — Telehealth: Payer: Self-pay | Admitting: Gastroenterology

## 2016-06-06 DIAGNOSIS — B1920 Unspecified viral hepatitis C without hepatic coma: Secondary | ICD-10-CM

## 2016-06-06 NOTE — Telephone Encounter (Signed)
Pt is due ultrasound in October

## 2016-06-06 NOTE — Telephone Encounter (Signed)
Order put in. Letter mailed.

## 2016-09-16 ENCOUNTER — Ambulatory Visit: Payer: Medicaid Other | Admitting: Gastroenterology

## 2016-09-20 ENCOUNTER — Ambulatory Visit: Payer: Medicaid Other | Admitting: Gastroenterology

## 2016-10-28 ENCOUNTER — Other Ambulatory Visit: Payer: Self-pay

## 2016-10-28 ENCOUNTER — Encounter: Payer: Self-pay | Admitting: Gastroenterology

## 2016-10-28 ENCOUNTER — Ambulatory Visit (INDEPENDENT_AMBULATORY_CARE_PROVIDER_SITE_OTHER): Payer: Medicaid Other | Admitting: Gastroenterology

## 2016-10-28 ENCOUNTER — Telehealth: Payer: Self-pay

## 2016-10-28 VITALS — BP 116/70 | HR 67 | Temp 97.6°F | Ht 72.0 in | Wt 223.0 lb

## 2016-10-28 DIAGNOSIS — B182 Chronic viral hepatitis C: Secondary | ICD-10-CM

## 2016-10-28 DIAGNOSIS — B1920 Unspecified viral hepatitis C without hepatic coma: Secondary | ICD-10-CM

## 2016-10-28 DIAGNOSIS — K7030 Alcoholic cirrhosis of liver without ascites: Secondary | ICD-10-CM | POA: Diagnosis not present

## 2016-10-28 NOTE — Progress Notes (Signed)
Called and spoke to [name] at the [facility]. She is checking on the Hep b vaccinations and will call back to let me know.
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
## 2016-10-28 NOTE — Telephone Encounter (Signed)
PA info submitted via [employer] website for Ultrasound Abd Complete. Additional clinical info also faxed. Case is pending.

## 2016-10-28 NOTE — Progress Notes (Signed)
Jared Frye, can we find out if patient ever had hepatitis B vaccinations as recommended?

## 2016-10-28 NOTE — Patient Instructions (Signed)
1. Please have your labs done today. 2. Abdominal ultrasound as scheduled.  3. Referral to the [facility] for Hepatitis C treatment 4. Return office visit in four months with Dr. Darrick PennaFields.

## 2016-10-28 NOTE — Progress Notes (Signed)
Call back from [place], pt has not had the hep b vaccinations. They will check with his PCP when he comes Thursday about getting it.

## 2016-10-28 NOTE — Assessment & Plan Note (Addendum)
Due for hepatoma screening, labs. It is unclear whether or not he actually received hepatitis B vaccinations as requested, we will see if there is any documentation available at group home. Today he seems somewhat somnolent. Unclear if this is medication effect versus hepatic encephalopathy. No obvious asterixis on exam. Patient is interested in pursuing hepatitis C treatment if possible. Due to multiple comorbidities would recommend [facility]. Patient in agreement. Referral to be made. Return to the office in 4 months with Dr. Darrick PennaFields.

## 2016-10-30 NOTE — Telephone Encounter (Signed)
Case is still pending on [employer] website. Refaxed clinical info.

## 2016-10-31 NOTE — Progress Notes (Signed)
CC'D TO PCP °

## 2016-10-31 NOTE — Progress Notes (Signed)
T/C from [place] at [facility]. She said they will check with the doctor today and find out if pt has ever had Hep B vaccinations. She said he was referred here for Hepatitis. I told her we just need to find out if he has ever had the Hep B series. She thought that we give those shots and is now aware that we do not.

## 2016-10-31 NOTE — Telephone Encounter (Signed)
Case for Ultrasound Abdomen Complete was approved. Auth# [id number]. Auth end date: 11/27/16.

## 2016-11-04 NOTE — Progress Notes (Signed)
Thanks Doris, correspondence noted.  We also need to know whether he had his labs done that were ordered 10/28/2016. If done at the home, we need a copy.

## 2016-11-05 ENCOUNTER — Other Ambulatory Visit (HOSPITAL_COMMUNITY): Payer: Medicaid Other

## 2016-11-05 NOTE — Progress Notes (Signed)
I spoke to [facility] at the [facility] and she is faxing the results to us.

## 2016-11-06 NOTE — Progress Notes (Signed)
Lupita LeashDonna faxed me just the orders that we had sent. I called her back and told her we need them to do the labs and fax the results. She said they have labs drawn on Wednesdays and the person has left today. It will probably be next week now, but they will have them drawn and fax results to us.

## 2016-11-07 ENCOUNTER — Ambulatory Visit (HOSPITAL_COMMUNITY)
Admission: RE | Admit: 2016-11-07 | Discharge: 2016-11-07 | Disposition: A | Discharge status: Home / Self Care | Payer: Medicaid Other | Source: Ambulatory Visit | Attending: Gastroenterology | Admitting: Gastroenterology

## 2016-11-07 DIAGNOSIS — B182 Chronic viral hepatitis C: Secondary | ICD-10-CM

## 2016-11-07 DIAGNOSIS — K8020 Calculus of gallbladder without cholecystitis without obstruction: Secondary | ICD-10-CM | POA: Diagnosis not present

## 2016-11-07 DIAGNOSIS — K7030 Alcoholic cirrhosis of liver without ascites: Secondary | ICD-10-CM | POA: Insufficient documentation

## 2016-11-07 DIAGNOSIS — K829 Disease of gallbladder, unspecified: Secondary | ICD-10-CM | POA: Insufficient documentation

## 2016-11-14 ENCOUNTER — Telehealth: Payer: Self-pay

## 2016-11-14 NOTE — Telephone Encounter (Signed)
Received fax from [facility]. Pt has appt 11/22/16 at 11:00am.

## 2016-11-17 NOTE — Progress Notes (Signed)
Gallstone and thickened gallbladder wall (stable over one year). No clinically evidence of cholecystitis. ?secondary to adjacent hepatocellular disease. Cirrhosis but no hepatoma.   Plan for ruq u/s in 6 months for hepatoma screening.

## 2016-11-18 NOTE — Progress Notes (Signed)
ON RECALL  °

## 2016-11-18 NOTE — Progress Notes (Signed)
Laurelyn SickleKatina at the [facility] is aware. I am faxing this info to her at [phone]. Forwarding to [place] to nic the [place] in 6 months.

## 2016-11-20 ENCOUNTER — Telehealth: Payer: Self-pay

## 2016-11-20 NOTE — Telephone Encounter (Signed)
Received labs dated 11/20/2016 Glucose 150, BUN 10.4, creatinine 0.64, sodium 137, potassium 3.5, total bilirubin 1.7, alkaline phosphatase 245, AST 47, ALT 16, albumin 3, white blood cell count 3500, hemoglobin 15.7, hematocrit 44.1, platelets 59,000, INR 1.5, ammonia 127 (normal 22-87).   MELD 13.   Doris,   For elevated ammonia level, please make sure patient is taking Xifaxan 550mg  BID, lactulose 30cc qid (needs to have 3 soft BMs daily), if sleepy, difficult to arouse then he should go to ER.   Please remind them that he has an appointment with [facility] 11/22/16 at 11am.

## 2016-11-20 NOTE — Telephone Encounter (Signed)
T/C from [place] at [facility], reporting the pt has an ammonia level of 129 and they were asked to call us and be advised.  They will be faxing the lab results to us and monitoring the pt at this time, as he is doing fine and just went out to smoke a cigarette.   Routing to Tana CoastLeslie Lewis, PA to advise!

## 2016-11-20 NOTE — Telephone Encounter (Signed)
noted 

## 2016-11-20 NOTE — Telephone Encounter (Signed)
Lab orders received via fax and placed on Leslie's desk.

## 2016-11-20 NOTE — Telephone Encounter (Signed)
I called and spoke with Jared Frye again. She said pt is taking the meds as ordered and they are aware to take him to ED if he is hard to arouse. I am faxing this info to her at [phone].  They are also aware of the appt on 11/22/2016.

## 2016-11-25 NOTE — Telephone Encounter (Signed)
Noted  

## 2016-11-25 NOTE — Telephone Encounter (Signed)
T/C from [place] at the [facility], pt is very confused today and they are sending [place] to the hospital in [place].

## 2016-12-24 ENCOUNTER — Telehealth: Payer: Self-pay

## 2016-12-24 NOTE — Telephone Encounter (Signed)
Lupita LeashDonna from [facility] called to let us know that his insurance will not cover the HBV shot. They are wanting to know if we would like to order it another way of what to do? Please advise

## 2016-12-25 NOTE — Telephone Encounter (Signed)
I have printed the [place] Medicaid requirements and pt them on LSL desk.

## 2016-12-25 NOTE — Telephone Encounter (Signed)
Jared FanningJulie, I'm not sure if we can do anything different to get it covered. Only thing I can think of is making sure dx of cirrhosis was used.

## 2016-12-26 NOTE — Telephone Encounter (Signed)
[place] Medicaid requirements placed on my desk are for Children.  Do they have approved requirements for adults with cirrhosis?

## 2016-12-27 NOTE — Telephone Encounter (Signed)
Paperwork has children at the top of the page but under hep B it states "adult use". I have this highlighted and placed on your desk.

## 2017-01-01 NOTE — Telephone Encounter (Signed)
Routing to Fortune BrandsDoris. SLF pt.

## 2017-01-01 NOTE — Telephone Encounter (Signed)
I called and spoke to [place] at the [facility]. She will check and see if pt is able to afford and she will call me back.

## 2017-01-01 NOTE — Telephone Encounter (Signed)
Reviewed [place] Medicaid requirements and based on their guidelines the patient does not qualify for assistance with vaccinations.   So if patient is unable to afford Hep B vaccinations, just document that.   [facility] also seeing [facility] and has follow up with them this month. Will continue to follow those records.

## 2017-01-06 NOTE — Telephone Encounter (Signed)
I called and spoke to Jared Frye and she said that pt CANNOT AFFORD the Hep B vaccinations.

## 2017-01-08 ENCOUNTER — Encounter: Payer: Self-pay | Admitting: Gastroenterology

## 2017-01-08 NOTE — Telephone Encounter (Signed)
Noted  

## 2017-01-22 ENCOUNTER — Telehealth: Payer: Self-pay | Admitting: Gastroenterology

## 2017-01-22 NOTE — Telephone Encounter (Signed)
I called [facility] and spoke with [facility] and made her aware of LLs recommendations

## 2017-01-22 NOTE — Telephone Encounter (Signed)
I called [facility] and spoke with the med tech(Catina) and she stated his ammonia level was 158 and this was ordered by Dr. Adriana Simas and he's going to change his Xifaxan to three times a day for 7 days.    The patient is still taking his Lactulose.  Dr. Elesa Massed wanted to know if we had any further recommendations in regards tho the elevated ammonia level.  Routing to [employer].

## 2017-01-22 NOTE — Telephone Encounter (Signed)
If patient is having drowsiness or confusion then would continue Xifaxan  BID, increase the lactulose until patient having 3 soft bowel movements daily. According to our records he takes lactulose 30cc four times daily. Can increase to 45cc four times daily. ALSO MAKE SURE NO ELECTROLYTE ABNORMALITIES OR INFECTION OR EVIDENCE OF OVERT GI BLEEDING.  If patient IS NOT DROWSY OR CONFUSED, I would not treat an elevated ammonia level as cirrhotics may not have NORMAL levels, would continue with his current regimen.

## 2017-01-22 NOTE — Telephone Encounter (Signed)
[facility] called to say that patient's ammoni level was 158 and needed to let the nurse be aware. Their number is [phone]

## 2017-01-22 NOTE — Telephone Encounter (Signed)
I also faxed the recommendations to [facility] at [phone].

## 2017-01-27 NOTE — Telephone Encounter (Signed)
Jared Frye, please see fax from [facility]. Messages dated 01/23/2017.   Dr. Molli Hazard Ward increased the Xifaxin 550 mg from bid to tid.   Also, they noted that pt has been on 45 ml Lactulose qid since 11/01/2016.  Please advise!

## 2017-01-28 NOTE — Telephone Encounter (Signed)
Received a fax from [facility] 01/24/2017 at 140PM.  Patient is dosed at lactulose 45cc (30G) QID since 10/2016.    Recommendations as previously noted on 01/22/17. IF PATIENT IS NOT CONFUSED OR DROWSY, no change in therapy is needed.  Please find out: #1 is the patient confused or drowsy #2 How many times daily is patient actually taking the lactulose, previously patient told me he did not take it regularly as prescribed. #3 how many bowel movements daily.

## 2017-01-29 NOTE — Telephone Encounter (Signed)
Would continue Xifaxan  BID. Continue lactulose 45cc qid to have 3-4 soft BMs daily. Would not recommend checking ammonia levels unless they are concerned that he is encephalopathic.

## 2017-01-29 NOTE — Telephone Encounter (Signed)
I spoke to pt's nurse, Laurelyn Sickle. She said pt is NOT having any drowsiness or confusion at this time. He IS taking Lactulose 45 cc's qid everyday. She said they have to document if he refuses, and he has not been refusing. Also, she said the pt said he is having 4 BM's daily.

## 2017-01-30 NOTE — Telephone Encounter (Signed)
Called and Doran Clay was busy. Left message for a return call.

## 2017-01-31 NOTE — Telephone Encounter (Signed)
I called and informed Lupita Leash, one of the nurses for the pt. I went over the directions and she repeated back to me and understood.

## 2017-02-27 ENCOUNTER — Ambulatory Visit (INDEPENDENT_AMBULATORY_CARE_PROVIDER_SITE_OTHER): Payer: Medicaid Other | Admitting: Gastroenterology

## 2017-02-27 ENCOUNTER — Encounter: Payer: Self-pay | Admitting: Gastroenterology

## 2017-02-27 DIAGNOSIS — K7030 Alcoholic cirrhosis of liver without ascites: Secondary | ICD-10-CM | POA: Diagnosis not present

## 2017-02-27 DIAGNOSIS — B182 Chronic viral hepatitis C: Secondary | ICD-10-CM | POA: Diagnosis not present

## 2017-02-27 DIAGNOSIS — K5901 Slow transit constipation: Secondary | ICD-10-CM | POA: Diagnosis not present

## 2017-02-27 NOTE — Assessment & Plan Note (Addendum)
TOO MANY BMS ON LACTULOSE  CHANGE TO PRN.

## 2017-02-27 NOTE — Progress Notes (Signed)
ON RECALL  °

## 2017-02-27 NOTE — Progress Notes (Signed)
CC'ED TO PCP 

## 2017-02-27 NOTE — Patient Instructions (Signed)
COMPLETE LABS.  CONTINUE XIFAXAN. CHANGE LACTULOSE TO AS NEEDED TO PREVENT CONSTIPATION.  RUQ ULTRASOUND 1 WEEK PRIOR TO YOUR NEXT VISIT IN NOV 2018.
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
## 2017-02-27 NOTE — Assessment & Plan Note (Addendum)
WELL COMPENSATED DISEASE. TOO MANY BMs WITH LACTULOSE.   CHECK LABS. CONSIDER HEP C TREATMENT WITH [facility]. CHANGE LACTULOSE TO PRN. CONTINUE XIFAXIN. RUQ U/S 1 WEEK PRIOR TO NEXT VISIT. FOLLOW UP IN 6 MOS.

## 2017-02-27 NOTE — Assessment & Plan Note (Addendum)
TREATMENT NAIVE AND APPEARS TO HAVE WELL COMPENSATED DISEASE.  LABS TODAY: CMP/PT/INR/CBC. IF CHILD PUGH A OR B, CONSIDER TREATMENT HEP C AT [facility]. HEP C GENOTYPE WITH REFLEX. FOLLOW UP IN 6 MOS.

## 2017-04-03 ENCOUNTER — Telehealth: Payer: Self-pay | Admitting: Gastroenterology

## 2017-04-03 NOTE — Telephone Encounter (Signed)
Letter mailed to pt.  

## 2017-04-03 NOTE — Telephone Encounter (Signed)
RECALL FOR RUQ ULTRASOUND  °

## 2017-04-17 ENCOUNTER — Telehealth: Payer: Self-pay

## 2017-04-17 NOTE — Telephone Encounter (Signed)
T[place]C [place] [facility] ( [phone]).  She said Dr. Elesa MassedWard from the facility wanted her to call us and let us know that pt's Ammonia level is 222. It was drawn yesterday. She said that we discontinued the Lactulose. Per Dr. Evelina DunField's last OV note, pt was to use prn. She said the pt has not asked for it. She said he is alert and acts fine. She said he has a Bm everyday, but she is not sure how many. Forwarding to Tana CoastLeslie Lewis, PA for recommendation in Dr. Evelina DunField's absence.

## 2017-04-17 NOTE — Telephone Encounter (Signed)
Called and informed Laurelyn SickleKatina. She said pt is taking the Xifaxin. She is aware of the recommendations by Tana CoastLeslie Lewis, PA and would like this faxed to her at [phone]. I am printing and faxing to her now.

## 2017-04-17 NOTE — Telephone Encounter (Signed)
If patient does not have evidence of hepatic encephalopathy, ie patient is alert and not confused, no tremors, then I would not necessarily change anything.   Please verify that patient is on Xifaxan 550mg  PO BID. He should have an order for lactulose 45mLs po four times a day as needed to prevent constipation.  Would not recommend checking ammonia levels unless patient has a change in mental status.

## 2017-04-28 ENCOUNTER — Telehealth: Payer: Self-pay

## 2017-04-28 NOTE — Telephone Encounter (Signed)
Noted  

## 2017-04-28 NOTE — Telephone Encounter (Signed)
REVIEWED-NO ADDITIONAL RECOMMENDATIONS. 

## 2017-04-28 NOTE — Telephone Encounter (Signed)
T/C from [name] at [facility], the pt needs PA on his Xifaxin.  She will have their phamacy [facility] fax over the PA request with insurance info. I will alert Raynelle FanningJulie to be on look out for the PA.

## 2017-07-09 ENCOUNTER — Telehealth: Payer: Self-pay | Admitting: Gastroenterology

## 2017-07-09 NOTE — Telephone Encounter (Signed)
RECALL FOR ULTRASOUND 

## 2017-07-09 NOTE — Telephone Encounter (Signed)
Letter mailed

## 2017-07-23 ENCOUNTER — Telehealth: Payer: Self-pay

## 2017-07-23 NOTE — Telephone Encounter (Signed)
Boneta LucksCalled Katina @ [phone] and informed her. She said they have already sent pt to the hospital. I am faxing these instructions to her @ [phone].

## 2017-07-23 NOTE — Telephone Encounter (Signed)
T/C from [place] at the [facility] reporting pt's Lactulose level of 256.8. I asked her who ordered the test and she said Gevena CottonMatt Ward, their facility physician. She said he told her to call and let us know since we were the ones that took him off of the Lactulose.  I told her we did not take him off, he was told to take it PRN.  Pt is not having any confusion or drowsiness.  She said Dr. Elesa MassedWard told her to send him to the ED and she is [facility].  I told her I would let Dr. Darrick PennaFields know.

## 2017-07-23 NOTE — Telephone Encounter (Signed)
PLEASE CALL PT'S FACILITY. IF PT I SNOT CONFUSED AN AMMONIA LEVEL OF 256 ONLY INDICATES A SIGNIFICANT IMPAIRMENT IN HEPATIC CLEARANCE. HE CAN START LACTULOSE 30 ML DAILY AND REPEAT Q6H IF NEEDED FOR CONSTIPATION OR CONFUSION.

## 2017-10-23 ENCOUNTER — Emergency Department (HOSPITAL_COMMUNITY): Payer: Medicaid Other

## 2017-10-23 ENCOUNTER — Other Ambulatory Visit: Payer: Self-pay

## 2017-10-23 ENCOUNTER — Inpatient Hospital Stay (HOSPITAL_COMMUNITY)
Admission: EM | Admit: 2017-10-23 | Discharge: 2017-10-29 | DRG: 442 | Disposition: A | Discharge status: Hospice | Payer: Medicaid Other | Attending: Family Medicine | Admitting: Family Medicine

## 2017-10-23 ENCOUNTER — Encounter (HOSPITAL_COMMUNITY): Payer: Self-pay | Admitting: Emergency Medicine

## 2017-10-23 DIAGNOSIS — Z87820 Personal history of traumatic brain injury: Secondary | ICD-10-CM | POA: Diagnosis not present

## 2017-10-23 DIAGNOSIS — I509 Heart failure, unspecified: Secondary | ICD-10-CM | POA: Diagnosis present

## 2017-10-23 DIAGNOSIS — I252 Old myocardial infarction: Secondary | ICD-10-CM

## 2017-10-23 DIAGNOSIS — R51 Headache: Secondary | ICD-10-CM | POA: Diagnosis present

## 2017-10-23 DIAGNOSIS — K7469 Other cirrhosis of liver: Secondary | ICD-10-CM

## 2017-10-23 DIAGNOSIS — F0390 Unspecified dementia without behavioral disturbance: Secondary | ICD-10-CM | POA: Diagnosis present

## 2017-10-23 DIAGNOSIS — D696 Thrombocytopenia, unspecified: Secondary | ICD-10-CM | POA: Diagnosis not present

## 2017-10-23 DIAGNOSIS — G8929 Other chronic pain: Secondary | ICD-10-CM | POA: Diagnosis present

## 2017-10-23 DIAGNOSIS — R339 Retention of urine, unspecified: Secondary | ICD-10-CM | POA: Diagnosis present

## 2017-10-23 DIAGNOSIS — B182 Chronic viral hepatitis C: Secondary | ICD-10-CM | POA: Diagnosis present

## 2017-10-23 DIAGNOSIS — E8809 Other disorders of plasma-protein metabolism, not elsewhere classified: Secondary | ICD-10-CM | POA: Diagnosis present

## 2017-10-23 DIAGNOSIS — R0789 Other chest pain: Secondary | ICD-10-CM

## 2017-10-23 DIAGNOSIS — I69354 Hemiplegia and hemiparesis following cerebral infarction affecting left non-dominant side: Secondary | ICD-10-CM | POA: Diagnosis not present

## 2017-10-23 DIAGNOSIS — K5900 Constipation, unspecified: Secondary | ICD-10-CM | POA: Diagnosis present

## 2017-10-23 DIAGNOSIS — F17210 Nicotine dependence, cigarettes, uncomplicated: Secondary | ICD-10-CM | POA: Diagnosis present

## 2017-10-23 DIAGNOSIS — Z801 Family history of malignant neoplasm of trachea, bronchus and lung: Secondary | ICD-10-CM | POA: Diagnosis not present

## 2017-10-23 DIAGNOSIS — R17 Unspecified jaundice: Secondary | ICD-10-CM | POA: Diagnosis not present

## 2017-10-23 DIAGNOSIS — L299 Pruritus, unspecified: Secondary | ICD-10-CM | POA: Diagnosis present

## 2017-10-23 DIAGNOSIS — Z79899 Other long term (current) drug therapy: Secondary | ICD-10-CM

## 2017-10-23 DIAGNOSIS — Z515 Encounter for palliative care: Secondary | ICD-10-CM

## 2017-10-23 DIAGNOSIS — T426X5A Adverse effect of other antiepileptic and sedative-hypnotic drugs, initial encounter: Secondary | ICD-10-CM | POA: Diagnosis present

## 2017-10-23 DIAGNOSIS — D638 Anemia in other chronic diseases classified elsewhere: Secondary | ICD-10-CM | POA: Diagnosis present

## 2017-10-23 DIAGNOSIS — E7220 Disorder of urea cycle metabolism, unspecified: Secondary | ICD-10-CM

## 2017-10-23 DIAGNOSIS — Z7189 Other specified counseling: Secondary | ICD-10-CM

## 2017-10-23 DIAGNOSIS — K7290 Hepatic failure, unspecified without coma: Secondary | ICD-10-CM | POA: Diagnosis not present

## 2017-10-23 DIAGNOSIS — E876 Hypokalemia: Secondary | ICD-10-CM | POA: Diagnosis present

## 2017-10-23 DIAGNOSIS — M545 Low back pain: Secondary | ICD-10-CM | POA: Diagnosis not present

## 2017-10-23 DIAGNOSIS — K7030 Alcoholic cirrhosis of liver without ascites: Secondary | ICD-10-CM | POA: Diagnosis present

## 2017-10-23 DIAGNOSIS — Y92009 Unspecified place in unspecified non-institutional (private) residence as the place of occurrence of the external cause: Secondary | ICD-10-CM | POA: Diagnosis not present

## 2017-10-23 DIAGNOSIS — E871 Hypo-osmolality and hyponatremia: Secondary | ICD-10-CM | POA: Diagnosis not present

## 2017-10-23 DIAGNOSIS — R0781 Pleurodynia: Secondary | ICD-10-CM | POA: Diagnosis present

## 2017-10-23 DIAGNOSIS — Z66 Do not resuscitate: Secondary | ICD-10-CM | POA: Diagnosis present

## 2017-10-23 DIAGNOSIS — B1920 Unspecified viral hepatitis C without hepatic coma: Secondary | ICD-10-CM | POA: Diagnosis present

## 2017-10-23 LAB — COMPREHENSIVE METABOLIC PANEL
ALT: 135 U/L — AB (ref 17–63)
AST: 300 U/L — ABNORMAL HIGH (ref 15–41)
Albumin: 1.6 g/dL — ABNORMAL LOW (ref 3.5–5.0)
Alkaline Phosphatase: 377 U/L — ABNORMAL HIGH (ref 38–126)
Anion gap: 8 (ref 5–15)
BUN: 18 mg/dL (ref 6–20)
CHLORIDE: 98 mmol/L — AB (ref 101–111)
CO2: 27 mmol/L (ref 22–32)
CREATININE: 1.08 mg/dL (ref 0.61–1.24)
Calcium: 8.1 mg/dL — ABNORMAL LOW (ref 8.9–10.3)
GFR calc non Af Amer: >60 mL/min (ref >60)
Glucose, Bld: 88 mg/dL (ref 65–99)
Potassium: 4.1 mmol/L (ref 3.5–5.1)
Sodium: 133 mmol/L — ABNORMAL LOW (ref 135–145)
Total Bilirubin: 11.8 mg/dL — ABNORMAL HIGH (ref 0.3–1.2)
Total Protein: 7.1 g/dL (ref 6.5–8.1)

## 2017-10-23 LAB — CBC WITH DIFFERENTIAL/PLATELET
BASOS PCT: 0 %
Basophils Absolute: 0 10*3/uL (ref 0.0–0.1)
EOS ABS: 0.1 10*3/uL (ref 0.0–0.7)
Eosinophils Relative: 1 %
HCT: 40.3 % (ref 39.0–52.0)
HEMOGLOBIN: 13.7 g/dL (ref 13.0–17.0)
LYMPHS ABS: 1.1 10*3/uL (ref 0.7–4.0)
Lymphocytes Relative: 14 %
MCH: 32 pg (ref 26.0–34.0)
MCHC: 34 g/dL (ref 30.0–36.0)
MCV: 94.2 fL (ref 78.0–100.0)
Monocytes Absolute: 1.3 10*3/uL — ABNORMAL HIGH (ref 0.1–1.0)
Monocytes Relative: 16 %
Neutro Abs: 5.5 10*3/uL (ref 1.7–7.7)
Neutrophils Relative %: 69 %
Platelets: 77 10*3/uL — ABNORMAL LOW (ref 150–400)
RBC: 4.28 MIL/uL (ref 4.22–5.81)
RDW: 21.8 % — ABNORMAL HIGH (ref 11.5–15.5)
WBC: 8 10*3/uL (ref 4.0–10.5)

## 2017-10-23 LAB — AMMONIA: AMMONIA: 102 umol/L — AB (ref 9–35)

## 2017-10-23 LAB — PROTIME-INR
INR: 1.45
PROTHROMBIN TIME: 17.5 s — AB (ref 11.4–15.2)

## 2017-10-23 MED ORDER — FENTANYL CITRATE (PF) 100 MCG/2ML IJ SOLN
50.0000 ug | INTRAMUSCULAR | Status: DC | PRN
  Administered 2017-10-23: 50 ug via INTRAVENOUS
  Filled 2017-10-23: qty 2

## 2017-10-23 MED ORDER — SODIUM CHLORIDE 0.9% FLUSH: 3.0000 mL | INTRAVENOUS | Status: DC | PRN

## 2017-10-23 MED ORDER — GABAPENTIN 300 MG PO CAPS
900.0000 mg | ORAL_CAPSULE | Freq: Four times a day (QID) | ORAL | Status: DC
  Administered 2017-10-23 – 2017-10-24 (×3): 900 mg via ORAL
  Filled 2017-10-23 (×3): qty 3

## 2017-10-23 MED ORDER — BENZTROPINE MESYLATE 1 MG PO TABS
1.0000 mg | ORAL_TABLET | Freq: Two times a day (BID) | ORAL | Status: DC
  Administered 2017-10-23 – 2017-10-29 (×12): 1 mg via ORAL
  Filled 2017-10-23 (×12): qty 1

## 2017-10-23 MED ORDER — IPRATROPIUM-ALBUTEROL 0.5-2.5 (3) MG/3ML IN SOLN
3.0000 mL | Freq: Four times a day (QID) | RESPIRATORY_TRACT | Status: DC
  Administered 2017-10-24 (×3): 3 mL via RESPIRATORY_TRACT
  Filled 2017-10-23 (×3): qty 3

## 2017-10-23 MED ORDER — OXYCODONE HCL 5 MG PO TABS
5.0000 mg | ORAL_TABLET | Freq: Four times a day (QID) | ORAL | Status: DC | PRN
  Administered 2017-10-23 – 2017-10-24 (×3): 5 mg via ORAL
  Filled 2017-10-23 (×3): qty 1

## 2017-10-23 MED ORDER — SODIUM CHLORIDE 0.9 % IV SOLN: 250.0000 mL | INTRAVENOUS | Status: DC | PRN

## 2017-10-23 MED ORDER — CYCLOSPORINE 0.05 % OP EMUL
1.0000 [drp] | Freq: Two times a day (BID) | OPHTHALMIC | Status: DC
  Administered 2017-10-23 – 2017-10-29 (×8): 1 [drp] via OPHTHALMIC
  Filled 2017-10-23 (×9): qty 1

## 2017-10-23 MED ORDER — DONEPEZIL HCL 5 MG PO TABS
10.0000 mg | ORAL_TABLET | Freq: Every day | ORAL | Status: DC
  Administered 2017-10-23 – 2017-10-28 (×6): 10 mg via ORAL
  Filled 2017-10-23 (×6): qty 2

## 2017-10-23 MED ORDER — CARVEDILOL 3.125 MG PO TABS
3.1250 mg | ORAL_TABLET | Freq: Two times a day (BID) | ORAL | Status: DC
  Administered 2017-10-24 – 2017-10-29 (×10): 3.125 mg via ORAL
  Filled 2017-10-23 (×10): qty 1

## 2017-10-23 MED ORDER — FUROSEMIDE 80 MG PO TABS
80.0000 mg | ORAL_TABLET | Freq: Every day | ORAL | Status: DC
  Administered 2017-10-24 – 2017-10-25 (×2): 80 mg via ORAL
  Filled 2017-10-23 (×2): qty 1

## 2017-10-23 MED ORDER — ALBUTEROL SULFATE (2.5 MG/3ML) 0.083% IN NEBU: 2.5000 mg | INHALATION_SOLUTION | RESPIRATORY_TRACT | Status: DC | PRN

## 2017-10-23 MED ORDER — SODIUM CHLORIDE 0.9% FLUSH
3.0000 mL | Freq: Two times a day (BID) | INTRAVENOUS | Status: DC
  Administered 2017-10-24 – 2017-10-25 (×2): 3 mL via INTRAVENOUS
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
## 2017-10-23 NOTE — ED Triage Notes (Signed)
[facility], states he has been itching for 3-4 days. [facility] and was told he had jaundice. Pt has hx of cirrhosis. C/o HA at this time, alert and oriented X4.

## 2017-10-23 NOTE — ED Notes (Signed)
Pt resting with eyes closed, appears to be in no distress. Respirations are even and unlabored.  

## 2017-10-23 NOTE — ED Notes (Signed)
Pt has not taken lactulose for 3 weeks.
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
## 2017-10-23 NOTE — ED Notes (Signed)
Report given to 300 at this time.  

## 2017-10-24 ENCOUNTER — Encounter (HOSPITAL_COMMUNITY): Payer: Self-pay | Admitting: Primary Care

## 2017-10-24 DIAGNOSIS — Z515 Encounter for palliative care: Secondary | ICD-10-CM

## 2017-10-24 DIAGNOSIS — D696 Thrombocytopenia, unspecified: Secondary | ICD-10-CM

## 2017-10-24 DIAGNOSIS — Z7189 Other specified counseling: Secondary | ICD-10-CM

## 2017-10-24 DIAGNOSIS — E871 Hypo-osmolality and hyponatremia: Secondary | ICD-10-CM

## 2017-10-24 DIAGNOSIS — R17 Unspecified jaundice: Secondary | ICD-10-CM

## 2017-10-24 DIAGNOSIS — E7220 Disorder of urea cycle metabolism, unspecified: Secondary | ICD-10-CM

## 2017-10-24 DIAGNOSIS — K7469 Other cirrhosis of liver: Secondary | ICD-10-CM

## 2017-10-24 DIAGNOSIS — B1920 Unspecified viral hepatitis C without hepatic coma: Secondary | ICD-10-CM

## 2017-10-24 LAB — ACETAMINOPHEN LEVEL: Acetaminophen (Tylenol), Serum: <10 ug/mL — ABNORMAL LOW (ref 10–30)

## 2017-10-24 LAB — COMPREHENSIVE METABOLIC PANEL
ALBUMIN: 1.5 g/dL — AB (ref 3.5–5.0)
ALT: 113 U/L — AB (ref 17–63)
AST: 262 U/L — AB (ref 15–41)
Alkaline Phosphatase: 338 U/L — ABNORMAL HIGH (ref 38–126)
Anion gap: 7 (ref 5–15)
BILIRUBIN TOTAL: 10.1 mg/dL — AB (ref 0.3–1.2)
BUN: 18 mg/dL (ref 6–20)
CALCIUM: 7.9 mg/dL — AB (ref 8.9–10.3)
CO2: 27 mmol/L (ref 22–32)
CREATININE: 0.99 mg/dL (ref 0.61–1.24)
Chloride: 98 mmol/L — ABNORMAL LOW (ref 101–111)
GFR calc Af Amer: >60 mL/min (ref >60)
GLUCOSE: 101 mg/dL — AB (ref 65–99)
Potassium: 4.2 mmol/L (ref 3.5–5.1)
Sodium: 132 mmol/L — ABNORMAL LOW (ref 135–145)
TOTAL PROTEIN: 6.6 g/dL (ref 6.5–8.1)

## 2017-10-24 MED ORDER — RIFAXIMIN 550 MG PO TABS
550.0000 mg | ORAL_TABLET | Freq: Two times a day (BID) | ORAL | Status: DC
  Administered 2017-10-24 – 2017-10-29 (×9): 550 mg via ORAL
  Filled 2017-10-24 (×9): qty 1

## 2017-10-24 MED ORDER — FENTANYL CITRATE (PF) 100 MCG/2ML IJ SOLN
25.0000 ug | INTRAMUSCULAR | Status: DC | PRN
  Administered 2017-10-24: 25 ug via INTRAVENOUS
  Filled 2017-10-24: qty 2

## 2017-10-24 MED ORDER — ACETYLCYSTEINE 20 % IN SOLN
140.0000 mg/kg | Freq: Once | RESPIRATORY_TRACT | Status: AC
  Administered 2017-10-24: 14660 mg via ORAL
  Filled 2017-10-24: qty 90

## 2017-10-24 MED ORDER — ACETYLCYSTEINE 20 % IN SOLN
70.0000 mg/kg | RESPIRATORY_TRACT | Status: DC
  Filled 2017-10-24 (×17): qty 60

## 2017-10-24 MED ORDER — FENTANYL CITRATE (PF) 100 MCG/2ML IJ SOLN
25.0000 ug | INTRAMUSCULAR | Status: DC | PRN
  Administered 2017-10-24 – 2017-10-28 (×18): 25 ug via INTRAVENOUS
  Filled 2017-10-24 (×19): qty 2

## 2017-10-24 MED ORDER — DIPHENHYDRAMINE HCL 25 MG PO CAPS
25.0000 mg | ORAL_CAPSULE | Freq: Four times a day (QID) | ORAL | Status: DC | PRN
  Administered 2017-10-24 – 2017-10-28 (×11): 25 mg via ORAL
  Filled 2017-10-24 (×11): qty 1
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
## 2017-10-24 NOTE — Care Management (Signed)
CM received call from [facility] rep and [facility] rep. HH has received referral to see pt at facility. They believe pt may be better cared for by hospice provider. Hospice rep, Leonette MostCharles says they are willing to accept pt onto their services. CM has relayed message to attending who will place palliative consult.

## 2017-10-25 DIAGNOSIS — R17 Unspecified jaundice: Secondary | ICD-10-CM

## 2017-10-25 DIAGNOSIS — K7469 Other cirrhosis of liver: Secondary | ICD-10-CM

## 2017-10-25 DIAGNOSIS — B1920 Unspecified viral hepatitis C without hepatic coma: Secondary | ICD-10-CM

## 2017-10-25 LAB — CBC
HCT: 35.7 % — ABNORMAL LOW (ref 39.0–52.0)
HEMOGLOBIN: 12.2 g/dL — AB (ref 13.0–17.0)
MCH: 31.7 pg (ref 26.0–34.0)
MCHC: 34.2 g/dL (ref 30.0–36.0)
MCV: 92.7 fL (ref 78.0–100.0)
PLATELETS: 78 10*3/uL — AB (ref 150–400)
RBC: 3.85 MIL/uL — AB (ref 4.22–5.81)
RDW: 21.5 % — ABNORMAL HIGH (ref 11.5–15.5)
WBC: 6.8 10*3/uL (ref 4.0–10.5)

## 2017-10-25 LAB — PROTIME-INR
INR: 1.58
Prothrombin Time: 18.8 s — ABNORMAL HIGH (ref 11.4–15.2)

## 2017-10-25 LAB — COMPREHENSIVE METABOLIC PANEL WITH GFR
ALT: 98 U/L — ABNORMAL HIGH (ref 17–63)
AST: 261 U/L — ABNORMAL HIGH (ref 15–41)
Albumin: 1.3 g/dL — ABNORMAL LOW (ref 3.5–5.0)
Alkaline Phosphatase: 277 U/L — ABNORMAL HIGH (ref 38–126)
Anion gap: 9 (ref 5–15)
BUN: 17 mg/dL (ref 6–20)
CO2: 28 mmol/L (ref 22–32)
Calcium: 7.5 mg/dL — ABNORMAL LOW (ref 8.9–10.3)
Chloride: 95 mmol/L — ABNORMAL LOW (ref 101–111)
Creatinine, Ser: 0.92 mg/dL (ref 0.61–1.24)
GFR calc Af Amer: >60 mL/min
GFR calc non Af Amer: >60 mL/min
Glucose, Bld: 65 mg/dL (ref 65–99)
Potassium: 3.3 mmol/L — ABNORMAL LOW (ref 3.5–5.1)
Sodium: 132 mmol/L — ABNORMAL LOW (ref 135–145)
Total Bilirubin: 10.2 mg/dL — ABNORMAL HIGH (ref 0.3–1.2)
Total Protein: 6.1 g/dL — ABNORMAL LOW (ref 6.5–8.1)

## 2017-10-25 LAB — LIPASE, BLOOD: Lipase: 38 U/L (ref 11–51)

## 2017-10-25 LAB — HEPATIC FUNCTION PANEL
ALT: 112 U/L — ABNORMAL HIGH (ref 17–63)
AST: 264 U/L — ABNORMAL HIGH (ref 15–41)
Albumin: 1.6 g/dL — ABNORMAL LOW (ref 3.5–5.0)
Alkaline Phosphatase: 320 U/L — ABNORMAL HIGH (ref 38–126)
Bilirubin, Direct: 5.2 mg/dL — ABNORMAL HIGH (ref 0.1–0.5)
Indirect Bilirubin: 4.8 mg/dL — ABNORMAL HIGH (ref 0.3–0.9)
Total Bilirubin: 10 mg/dL — ABNORMAL HIGH (ref 0.3–1.2)
Total Protein: 6.6 g/dL (ref 6.5–8.1)

## 2017-10-25 LAB — HEPATITIS PANEL, ACUTE
HCV Ab: >11 {s_co_ratio} — ABNORMAL HIGH (ref 0.0–0.9)
Hep A IgM: NEGATIVE
Hep B C IgM: NEGATIVE
Hepatitis B Surface Ag: NEGATIVE

## 2017-10-25 LAB — HIV ANTIBODY (ROUTINE TESTING W REFLEX): HIV SCREEN 4TH GENERATION: NONREACTIVE

## 2017-10-25 MED ORDER — IPRATROPIUM-ALBUTEROL 0.5-2.5 (3) MG/3ML IN SOLN
3.0000 mL | Freq: Four times a day (QID) | RESPIRATORY_TRACT | Status: DC
  Administered 2017-10-25 – 2017-10-28 (×10): 3 mL via RESPIRATORY_TRACT
  Filled 2017-10-25 (×11): qty 3

## 2017-10-25 MED ORDER — ALBUMIN HUMAN 25 % IV SOLN
50.0000 g | Freq: Every day | INTRAVENOUS | Status: AC
  Administered 2017-10-25 – 2017-10-27 (×3): 50 g via INTRAVENOUS
  Filled 2017-10-25 (×3): qty 200

## 2017-10-25 MED ORDER — ACETYLCYSTEINE 20 % IN SOLN
70.0000 mg/kg | Freq: Four times a day (QID) | RESPIRATORY_TRACT | Status: DC
  Administered 2017-10-26: 7320 mg via ORAL
  Filled 2017-10-25 (×18): qty 60

## 2017-10-25 MED ORDER — LACTULOSE 10 GM/15ML PO SOLN
20.0000 g | Freq: Two times a day (BID) | ORAL | Status: DC
  Administered 2017-10-25 – 2017-10-29 (×8): 20 g via ORAL
  Filled 2017-10-25 (×9): qty 30

## 2017-10-25 MED ORDER — ACETYLCYSTEINE 20 % IN SOLN
RESPIRATORY_TRACT | Status: AC
  Filled 2017-10-25: qty 4

## 2017-10-25 MED ORDER — POTASSIUM CHLORIDE CRYS ER 20 MEQ PO TBCR
40.0000 meq | EXTENDED_RELEASE_TABLET | Freq: Once | ORAL | Status: AC
  Administered 2017-10-25: 40 meq via ORAL
  Filled 2017-10-25: qty 2

## 2017-10-25 MED ORDER — SODIUM CHLORIDE 0.9 % IV SOLN
INTRAVENOUS | Status: AC
  Administered 2017-10-25: 12:00:00 via INTRAVENOUS

## 2017-10-25 NOTE — Progress Notes (Addendum)
PROGRESS NOTE    Jared Frye  ZOX:[id number] DOB: 27-Jan-1961 DOA: 10/23/2017 PCP: System, Provider Not In   Brief Narrative:   This is a 57 year old male with history of cirrhosis and untreated hepatitis C who was sent for evaluation of scleral icterus and was admitted with decompensated cirrhosis.  He states that his right-sided rib pain as well as itching has improved with medications.  GI is in the process of evaluating this patient and have discontinued gabapentin as well as Tylenol and placed on Mucomyst.  His hepatitis C antibody is elevated and he is noted to have ongoing elevated bilirubin as well as INR level.  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, hepatitis C with t bili now 10.2, thrombocytopenia, hyperammonemia - alert oriented, started on Xifaxan yesterday -Gabapentin DC along with Tylenol and is s/p Mucomyst - GI consult for for further recs with Hep C Ab positive - CMP in AM -Benadryl for itching -Fentanyl for pain -Palliative care evaluation with recommendations for hospice care at ALF that pt is agreeable to  Mild hyponatremia with hypochloridemia -Gentle, time-limited NS -Hold Lasix for now -Pt is euvolemic->dry  Mild hypokalemia -Oral potassium -check mg in AM  Dementia, traumatic brain injury - continue donepezil   PMH stroke with left hemiparesis  Chronic pain, headache - avoid Tylenol, NSAIDs.   DVT prophylaxis:SCDs Code Status: DNR Family Communication: None Disposition Plan: [facility] with possible hospice on DC   Consultants:   GI  Procedures:    None  Antimicrobials:   Xifaxan 1/18->   Subjective: Patient seen and evaluated today with no new acute complaints or concerns.  He states that his pain is well controlled with fentanyl as well as his itching with Benadryl.  No acute overnight events have been noted.  He is otherwise alert and does not appear to be confused.  Objective: Vitals:   10/24/17 2010 10/24/17 2013 10/25/17 0604 10/25/17 1036  BP:  102/69 (!) 114/59   Pulse: 79 78 70   Resp: 18 18 18    Temp:  99.4 F (37.4 C) 98.7 F (37.1 C)   TempSrc:  Oral Oral   SpO2: 90% 94% 90% 91%  Weight:      Height:        Intake/Output Summary (Last 24 hours) at 10/25/2017 1101 Last data filed at 10/25/2017 0604 Gross per 24 hour  Intake 840 ml  Output 1300 ml  Net -460 ml   Filed Weights   10/23/17 1614 10/23/17 2100  Weight: 105.2 kg (232 lb) 104.7 kg (230 lb 13.2 oz)    Examination:  General exam: Appears calm and comfortable  Respiratory system: Clear to auscultation. Respiratory effort normal. Cardiovascular system: S1 & S2 heard, RRR. No JVD, murmurs, rubs, gallops or clicks. No pedal edema. Gastrointestinal system: Abdomen is nondistended, soft and nontender. No organomegaly or masses felt. Normal bowel sounds heard. Central nervous system: Alert and oriented. No focal neurological deficits. Extremities: Symmetric 5 x 5 power. Skin: No rashes, lesions or ulcers; icteric Psychiatry: Judgement and insight appear normal. Mood & affect appropriate.     Data Reviewed: I have personally reviewed  following labs and imaging studies  CBC: Recent Labs  Lab 10/23/17 1627 10/25/17 0638  WBC 8.0 6.8  NEUTROABS 5.5  --   HGB 13.7 12.2*  HCT 40.3 35.7*  MCV 94.2 92.7  PLT 77* 78*   Basic Metabolic Panel: Recent Labs  Lab 10/23/17 1627 10/24/17 0556 10/25/17 0638  NA 133* 132* 132*  K 4.1 4.2 3.3*  CL 98* 98* 95*  CO2 27 27 28   GLUCOSE 88 101* 65  BUN 18 18 17   CREATININE 1.08 0.99 0.92   CALCIUM 8.1* 7.9* 7.5*   GFR: Estimated Creatinine Clearance: 113.4 mL/min (by C-G formula based on SCr of 0.92 mg/dL). Liver Function Tests: Recent Labs  Lab 10/23/17 1627 10/24/17 0556 10/25/17 0638  AST 300* 264*  262* 261*  ALT 135* 112*  113* 98*  ALKPHOS 377* 320*  338* 277*  BILITOT 11.8* 10.0*  10.1* 10.2*  PROT 7.1 6.6  6.6 6.1*  ALBUMIN 1.6* 1.6*  1.5* 1.3*   Recent Labs  Lab 10/24/17 0556  LIPASE 38   Recent Labs  Lab 10/23/17 1627  AMMONIA 102*   Coagulation Profile: Recent Labs  Lab 10/23/17 1627 10/25/17 0638  INR 1.45 1.58   Cardiac Enzymes: No results for input(s): CKTOTAL, CKMB, CKMBINDEX, TROPONINI in the last 168 hours. BNP (last 3 results) No results for input(s): PROBNP in the last 8760 hours. HbA1C: No results for input(s): HGBA1C in the last 72 hours. CBG: No results for input(s): GLUCAP in the last 168 hours. Lipid Profile: No results for input(s): CHOL, HDL, LDLCALC, TRIG, CHOLHDL, LDLDIRECT in the last 72 hours. Thyroid Function Tests: No results for input(s): TSH, T4TOTAL, FREET4, T3FREE, THYROIDAB in the last 72 hours. Anemia Panel: No results for input(s): VITAMINB12, FOLATE, FERRITIN, TIBC, IRON, RETICCTPCT in the last 72 hours. Sepsis Labs: No results for input(s): PROCALCITON, LATICACIDVEN in the last 168 hours.  No results found for this or any previous visit (from the past 240 hour(s)).       Radiology Studies: Dg Ribs Unilateral W/chest Right  Result Date: 10/23/2017 CLINICAL DATA:  Cough with right upper quadrant pain EXAM: RIGHT RIBS AND CHEST - 3+ VIEW COMPARISON:  01/05/2016 FINDINGS: Single-view chest demonstrates no acute consolidation or effusion. Normal heart size. Aortic atherosclerosis. No pneumothorax. Right rib series demonstrates no definite acute displaced right rib fracture. Old appearing right tenth rib fracture. IMPRESSION: 1. Negative for pneumothorax or pleural effusion 2. No definite acute  displaced right rib fracture Electronically Signed   By: Jasmine Pang M.D.   On: 10/23/2017 17:14   US Abdomen Complete  Result Date: 10/23/2017 CLINICAL DATA:  Right upper quadrant pain and distension. EXAM: ABDOMEN ULTRASOUND COMPLETE COMPARISON:  November 07, 2016 FINDINGS: Gallbladder: Gallstones are identified in the gallbladder. The gallbladder wall is thickened measuring 15 mm. There is pericholecystic fluid. The sonographer reports no sonographic Murphy sign. Common bile duct: Diameter: 3 mm Liver: There is increased echotexture of the liver with heterogeneity and nodular contour. No definite focal discrete liver lesion is noted. Portal vein is patent on color Doppler imaging with normal direction of blood flow towards the liver. IVC: No abnormality visualized. Pancreas: Not well visualized. Spleen: The spleen is enlarged measuring 16.7 cm Right Kidney: Length: 11.8 cm. Echogenicity within normal limits. No mass or hydronephrosis visualized. Left Kidney: Length: 11.2 cm. Echogenicity within normal limits. No mass or hydronephrosis visualized. Abdominal aorta: No aneurysm visualized. Other findings: Minimal ascites is identified. IMPRESSION: Gallbladder wall thickening with gallstones and  pericholecystic fluid. The sonographer reports no sonographic Murphy sign. The findings were seen on prior ultrasound and may be in part due to adjacent hepatocellular disease. However, cholecystitis cannot be excluded. Cirrhosis of liver.  Splenomegaly. Electronically Signed   By: Sherian ReinWei-Chen  Lin M.D.   On: 10/23/2017 17:47        Scheduled Meds: . acetylcysteine  70 mg/kg Oral Q6H WA  . benztropine  1 mg Oral BID  . carvedilol  3.125 mg Oral BID WC  . cycloSPORINE  1 drop Both Eyes BID  . donepezil  10 mg Oral QHS  . ipratropium-albuterol  3 mL Nebulization Q6H WA  . potassium chloride  40 mEq Oral Once  . rifaximin  550 mg Oral BID   Continuous Infusions: . sodium chloride       LOS: 2 days     Time spent: 30 minutes    Jmari Pelc Hoover BrunetteD Kinzee Happel, DO [facility] Pager [phone]  If 7PM-7AM, please contact night-coverage [email or web address] Password [email or web address] 10/25/2017, 11:01 AM

## 2017-10-25 NOTE — Progress Notes (Signed)
  Subjective:  Patient states he is not able to void.  He did void once earlier today.  He feels uncomfortable.  Nursing staff was asked to do in and out cath and over 400 mL of urine removed.  Patient reported resolution of his hypogastric discomfort.  He states he ate all of his breakfast.  He has not had a bowel movement in over 24 hours.  Objective: Blood pressure (!) 114/59, pulse 70, temperature 98.7 F (37.1 C), temperature source Oral, resp. rate 18, height 6' (1.829 m), weight 230 lb 13.2 oz (104.7 kg), SpO2 91 %. Patient is alert.  He is jaundiced. Abdomen abdomen is full.  Bowel sounds are normal.  Mild discomfort noted in hypogastric region. He has 1+ pitting edema involving both legs.  Labs/studies Results:  Recent Labs    10/23/17 1627 10/25/17 0638  WBC 8.0 6.8  HGB 13.7 12.2*  HCT 40.3 35.7*  PLT 77* 78*    BMET  Recent Labs    10/23/17 1627 10/24/17 0556 10/25/17 0638  NA 133* 132* 132*  K 4.1 4.2 3.3*  CL 98* 98* 95*  CO2 27 27 28   GLUCOSE 88 101* 65  BUN 18 18 17   CREATININE 1.08 0.99 0.92  CALCIUM 8.1* 7.9* 7.5*    LFT  Recent Labs    10/23/17 1627 10/24/17 0556 10/25/17 0638  PROT 7.1 6.6  6.6 6.1*  ALBUMIN 1.6* 1.6*  1.5* 1.3*  AST 300* 264*  262* 261*  ALT 135* 112*  113* 98*  ALKPHOS 377* 320*  338* 277*  BILITOT 11.8* 10.0*  10.1* 10.2*  BILIDIR  --  5.2*  --   IBILI  --  4.8*  --     PT/INR  Recent Labs    10/23/17 1627 10/25/17 0638  LABPROT 17.5* 18.8*  INR 1.45 1.58    Hepatitis Panel  Recent Labs    10/24/17 0556  HEPBSAG Negative  HCVAB >11.0*  HEPAIGM Negative  HEPBIGM Negative     Assessment:  #1.  Cholestasis in a patient with known history of cirrhosis secondary to chronic hepatitis C and prior ethanol use.  He also has an evidence of hepatocellular injury.  Acute injury felt to be secondary to medications.  Gabapentin has been discontinued.  Transaminases are coming down but bilirubin is not.  He has  profound hypoalbuminemia and would benefit from albumin infusion. Regarding hepatitis C he is not a candidate for therapy at this time.  Once acute injury has resolved this issue can be revisited.  #2.  Chronic pain.  He is presently on fentanyl.  #3.  Urinary retention.  In and out cath reveals over 450 mL of urine.  Urinary retention possibly due to medication and prior neurologic injury.  He may need Foley's catheter if he is unable to void.  Recommendations:  Albumin 50 g IV daily x3 doses. LFTs and INR in a.m. Resume lactulose at a dose of 20 g p.o. twice daily.

## 2017-10-26 LAB — CBC
HCT: 35.1 % — ABNORMAL LOW (ref 39.0–52.0)
Hemoglobin: 11.9 g/dL — ABNORMAL LOW (ref 13.0–17.0)
MCH: 31.7 pg (ref 26.0–34.0)
MCHC: 33.9 g/dL (ref 30.0–36.0)
MCV: 93.6 fL (ref 78.0–100.0)
PLATELETS: 55 10*3/uL — AB (ref 150–400)
RBC: 3.75 MIL/uL — AB (ref 4.22–5.81)
RDW: 21.6 % — AB (ref 11.5–15.5)
WBC: 5 10*3/uL (ref 4.0–10.5)

## 2017-10-26 LAB — COMPREHENSIVE METABOLIC PANEL
ALK PHOS: 253 U/L — AB (ref 38–126)
ALT: 93 U/L — ABNORMAL HIGH (ref 17–63)
AST: 291 U/L — ABNORMAL HIGH (ref 15–41)
Albumin: 1.6 g/dL — ABNORMAL LOW (ref 3.5–5.0)
Anion gap: 10 (ref 5–15)
BUN: 19 mg/dL (ref 6–20)
CALCIUM: 7.9 mg/dL — AB (ref 8.9–10.3)
CO2: 28 mmol/L (ref 22–32)
CREATININE: 0.96 mg/dL (ref 0.61–1.24)
Chloride: 100 mmol/L — ABNORMAL LOW (ref 101–111)
Glucose, Bld: 77 mg/dL (ref 65–99)
Potassium: 3.3 mmol/L — ABNORMAL LOW (ref 3.5–5.1)
SODIUM: 138 mmol/L (ref 135–145)
Total Bilirubin: 9.7 mg/dL — ABNORMAL HIGH (ref 0.3–1.2)
Total Protein: 6 g/dL — ABNORMAL LOW (ref 6.5–8.1)

## 2017-10-26 LAB — MAGNESIUM: MAGNESIUM: 2 mg/dL (ref 1.7–2.4)

## 2017-10-26 MED ORDER — POTASSIUM CHLORIDE CRYS ER 20 MEQ PO TBCR
40.0000 meq | EXTENDED_RELEASE_TABLET | Freq: Two times a day (BID) | ORAL | Status: AC
  Administered 2017-10-26 (×2): 40 meq via ORAL
  Filled 2017-10-26 (×2): qty 2

## 2017-10-26 NOTE — Progress Notes (Signed)
  Subjective:  Patient has no complaints.  He states he is not itching anymore.  He has good appetite.  He denies abdominal pain.  He states he has been passing urine without any difficulty.  Bowels move yesterday. He does not feel like getting up in the chair.  [facility] includes bed and wheelchair that he can propel himself.  Objective: Blood pressure (!) 98/46, pulse 70, temperature (!) 97.2 F (36.2 C), temperature source Oral, resp. rate 18, height 6' (1.829 m), weight 230 lb 13.2 oz (104.7 kg), SpO2 95 %. Patient is alert and in no acute distress. He has asterixis.  He has deformity to right side of scalp. He remains jaundiced. Abdomen is full but soft and nontender without organomegaly or masses. He has 2+ pitting edema involving left.   Labs/studies Results:  Recent Labs    10/23/17 1627 10/25/17 0638 10/26/17 0544  WBC 8.0 6.8 5.0  HGB 13.7 12.2* 11.9*  HCT 40.3 35.7* 35.1*  PLT 77* 78* 55*    BMET  Recent Labs    10/24/17 0556 10/25/17 0638 10/26/17 0544  NA 132* 132* 138  K 4.2 3.3* 3.3*  CL 98* 95* 100*  CO2 27 28 28   GLUCOSE 101* 65 77  BUN 18 17 19   CREATININE 0.99 0.92 0.96  CALCIUM 7.9* 7.5* 7.9*    LFT  Recent Labs    10/24/17 0556 10/25/17 0638 10/26/17 0544  PROT 6.6  6.6 6.1* 6.0*  ALBUMIN 1.6*  1.5* 1.3* 1.6*  AST 264*  262* 261* 291*  ALT 112*  113* 98* 93*  ALKPHOS 320*  338* 277* 253*  BILITOT 10.0*  10.1* 10.2* 9.7*  BILIDIR 5.2*  --   --   IBILI 4.8*  --   --     PT/INR  Recent Labs    10/23/17 1627 10/25/17 0638  LABPROT 17.5* 18.8*  INR 1.45 1.58    Hepatitis Panel  Recent Labs    10/24/17 0556  HEPBSAG Negative  HCVAB >11.0*  HEPAIGM Negative  HEPBIGM Negative     Assessment:  #1.  Acute liver injury with predominantly cholestatic pattern most likely secondary to gabapentin.  Pattern not typical for acetaminophen-induced injury although it may have contributed.  Patient was treated with  Mucomyst.  Bilirubin slowly coming down.  Transaminases without significant change.  Patient received second dose of IV albumin today.  #2.  Cirrhosis secondary to chronic hepatitis C and prior ethanol use. Patient may be a candidate for antiviral therapy down the road once hepatic function stabilizes.  Disease complicated by hepatic encephalopathy anemia and thrombocytopenia.  #3.  Anemia appears to be due to chronic disease.  #4.  Other problems include mild hypokalemia chronic headache history of head injury and left hemiparesis.  Recommendations:  LFTs, INR and serum ammonia in a.m. Continue lactulose and Xifaxan at current dose. Patient will receive her third and final dose of albumin infusion in a.m.

## 2017-10-26 NOTE — Progress Notes (Signed)
PROGRESS NOTE    Jared Frye  ZOX:[id number] DOB: 12/29/1960 DOA: 10/23/2017 PCP: System, Provider Not In   Brief Narrative:   This is a 57 year old male with history of cirrhosis and untreated hepatitis C who was sent for evaluation of scleral icterus and was admitted with decompensated cirrhosis.  He states that his right-sided rib pain as well as itching have improved with medications.  GI has evaluated this patient and believe that much of his symptomatology is related to high doses of gabapentin which has now been discontinued.  His hepatitis C antibody is elevated and he was noted to have an elevated bilirubin as well as INR level which appear stable with minimal improvement after Mucomyst was given.  Palliative care has seen the patient and he is currently DNR and agreeable to hospice care at his assisted living facility upon discharge.  GI plans to continue albumin infusion for hypoalbuminemia through today with monitoring of LFTs in a.m. And plans for possible discharge by a.m. if stable and improved.  He has also been started on lactulose. He has also had some mild urinary retention which has spontaneously improved.  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, hepatitis C with t bili now 9.7, thrombocytopenia, hyperammonemia - alert oriented, started on Xifaxan on 10/24/2017 and lactulose on 10/25/2016 -Gabapentin DC along with Tylenol and is s/p Mucomyst - GI to continue Albumin through today; Hep C could be treated in outpatient setting later - CMP/INR in AM -Benadryl for itching -Fentanyl for pain -Palliative  care evaluation with recommendations for hospice care at ALF that pt is agreeable to on DC  Mild hyponatremia with hypochloridemia-resolved -Hold Lasix for now with euvolemia and hypokalemia  Mild hypokalemia -Oral potassium today -Hold lasix and repeat cmp am  Dementia, traumatic brain injury - continue donepezil   PMH stroke with left hemiparesis  Chronic pain, headache - avoid Tylenol, NSAIDs.   DVT prophylaxis:SCDs Code Status: DNR Family Communication: None Disposition Plan: [facility] with possible hospice on DC   Consultants:   GI  Palliative care  Procedures:   None  Antimicrobials:   Xifaxan 1/18->   Subjective: Patient seen and evaluated today with no new acute complaints or concerns.  He states that his pain is well controlled with fentanyl as well as his itching with Benadryl.  No acute overnight events have been noted.  He is otherwise alert and does not appear to be confused. No further urinary retention noted today.  Objective: Vitals:   10/25/17 1932 10/25/17 1945 10/26/17 0622 10/26/17 0949  BP:  (!) 114/48 (!) 98/46   Pulse:  80 70   Resp:  18 18   Temp:  98.7 F (37.1 C) (!) 97.2 F (36.2 C)   TempSrc:  Oral Oral   SpO2: 92% 93% 93% 95%  Weight:      Height:        Intake/Output Summary (Last 24 hours) at 10/26/2017 1126 Last data filed at 10/26/2017 1000 Gross per 24 hour  Intake 1215.83 ml  Output 1400 ml  Net -184.17 ml   Filed Weights   10/23/17 1614 10/23/17 2100  Weight: 105.2 kg (232 lb) 104.7 kg (230 lb 13.2 oz)    Examination:  General exam: Appears calm and comfortable  Respiratory system: Clear to auscultation. Respiratory effort normal. Cardiovascular system: S1 & S2 heard, RRR. No JVD, murmurs, rubs, gallops or clicks. No pedal edema. Gastrointestinal system: Abdomen is nondistended, soft and nontender. No organomegaly or masses felt. Normal bowel sounds heard. Central nervous system: Alert and oriented. No  focal neurological deficits. Extremities: Symmetric 5 x 5 power. Skin: No rashes, lesions or ulcers; icteric Psychiatry: Judgement and insight appear normal. Mood & affect appropriate.     Data Reviewed: I have personally reviewed following labs and imaging studies  CBC: Recent Labs  Lab 10/23/17 1627 10/25/17 0638 10/26/17 0544  WBC 8.0 6.8 5.0  NEUTROABS 5.5  --   --   HGB 13.7 12.2* 11.9*  HCT 40.3 35.7* 35.1*  MCV 94.2 92.7 93.6  PLT 77* 78* 55*   Basic Metabolic Panel: Recent Labs  Lab 10/23/17 1627 10/24/17 0556 10/25/17 0638 10/26/17 0544  NA 133* 132* 132* 138  K 4.1 4.2 3.3* 3.3*  CL 98* 98* 95* 100*  CO2 27 27 28 28   GLUCOSE 88 101* 65 77  BUN 18 18 17 19   CREATININE 1.08 0.99 0.92 0.96  CALCIUM 8.1* 7.9* 7.5* 7.9*  MG  --   --   --  2.0   GFR: Estimated Creatinine Clearance: 108.7 mL/min (by C-G formula based on SCr of 0.96 mg/dL). Liver Function Tests: Recent Labs  Lab 10/23/17 1627 10/24/17 0556 10/25/17 0638 10/26/17 0544  AST 300* 264*  262* 261* 291*  ALT 135* 112*  113* 98* 93*  ALKPHOS 377* 320*  338* 277* 253*  BILITOT 11.8* 10.0*  10.1* 10.2* 9.7*  PROT 7.1 6.6  6.6 6.1* 6.0*  ALBUMIN 1.6* 1.6*  1.5* 1.3* 1.6*   Recent Labs  Lab 10/24/17 0556  LIPASE 38   Recent Labs  Lab 10/23/17 1627  AMMONIA 102*   Coagulation Profile: Recent Labs  Lab 10/23/17 1627 10/25/17 0638  INR 1.45 1.58   Cardiac Enzymes: No results for input(s): CKTOTAL, CKMB, CKMBINDEX, TROPONINI in the last 168 hours. BNP (last 3 results) No results for input(s): PROBNP in the last 8760 hours. HbA1C: No results for input(s): HGBA1C in the last 72 hours. CBG: No results for input(s): GLUCAP in the last 168 hours. Lipid Profile: No results for input(s): CHOL, HDL, LDLCALC, TRIG, CHOLHDL, LDLDIRECT in the last 72 hours. Thyroid Function Tests: No results for input(s): TSH, T4TOTAL, FREET4, T3FREE, THYROIDAB in the last 72 hours. Anemia Panel: No  results for input(s): VITAMINB12, FOLATE, FERRITIN, TIBC, IRON, RETICCTPCT in the last 72 hours. Sepsis Labs: No results for input(s): PROCALCITON, LATICACIDVEN in the last 168 hours.  No results found for this or any previous visit (from the past 240 hour(s)).       Radiology Studies: No results found.      Scheduled Meds: . acetylcysteine  70 mg/kg Oral Q6H WA  . benztropine  1 mg Oral BID  . carvedilol  3.125 mg Oral BID WC  . cycloSPORINE  1 drop Both Eyes BID  . donepezil  10 mg Oral QHS  . ipratropium-albuterol  3 mL Nebulization Q6H WA  . lactulose  20 g Oral BID  . rifaximin  550 mg Oral BID   Continuous Infusions: . albumin human       LOS: 3 days    Time spent: 30 minutes    Jared Frye  Hoover Brunette, DO [facility] Pager [phone]  If 7PM-7AM, please contact night-coverage [email or web address] Password [facility] 10/26/2017, 11:26 AM

## 2017-10-27 ENCOUNTER — Encounter (HOSPITAL_COMMUNITY): Payer: Self-pay | Admitting: Gastroenterology

## 2017-10-27 LAB — COMPREHENSIVE METABOLIC PANEL
ALT: 83 U/L — ABNORMAL HIGH (ref 17–63)
ANION GAP: 6 (ref 5–15)
AST: 248 U/L — ABNORMAL HIGH (ref 15–41)
Albumin: 1.9 g/dL — ABNORMAL LOW (ref 3.5–5.0)
Alkaline Phosphatase: 232 U/L — ABNORMAL HIGH (ref 38–126)
BILIRUBIN TOTAL: 10.6 mg/dL — AB (ref 0.3–1.2)
BUN: 17 mg/dL (ref 6–20)
CALCIUM: 8 mg/dL — AB (ref 8.9–10.3)
CO2: 26 mmol/L (ref 22–32)
CREATININE: 0.81 mg/dL (ref 0.61–1.24)
Chloride: 103 mmol/L (ref 101–111)
GFR calc non Af Amer: >60 mL/min (ref >60)
Glucose, Bld: 81 mg/dL (ref 65–99)
Potassium: 3.7 mmol/L (ref 3.5–5.1)
Sodium: 135 mmol/L (ref 135–145)
TOTAL PROTEIN: 6.1 g/dL — AB (ref 6.5–8.1)

## 2017-10-27 LAB — CBC
HCT: 33.9 % — ABNORMAL LOW (ref 39.0–52.0)
HEMOGLOBIN: 11.4 g/dL — AB (ref 13.0–17.0)
MCH: 31.8 pg (ref 26.0–34.0)
MCHC: 33.6 g/dL (ref 30.0–36.0)
MCV: 94.4 fL (ref 78.0–100.0)
PLATELETS: 61 10*3/uL — AB (ref 150–400)
RBC: 3.59 MIL/uL — AB (ref 4.22–5.81)
RDW: 22.2 % — ABNORMAL HIGH (ref 11.5–15.5)
WBC: 4.6 10*3/uL (ref 4.0–10.5)

## 2017-10-27 LAB — PROTIME-INR
INR: 1.67
PROTHROMBIN TIME: 19.5 s — AB (ref 11.4–15.2)

## 2017-10-27 LAB — AMMONIA: Ammonia: 71 umol/L — ABNORMAL HIGH (ref 9–35)

## 2017-10-27 MED ORDER — ACETYLCYSTEINE 20 % IN SOLN
70.0000 mg/kg | RESPIRATORY_TRACT | Status: DC
  Administered 2017-10-27: 7320 mg via ORAL
  Filled 2017-10-27 (×5): qty 60

## 2017-10-27 MED ORDER — ACETYLCYSTEINE 20 % IN SOLN
140.0000 mg/kg | Freq: Once | RESPIRATORY_TRACT | Status: DC
  Filled 2017-10-27: qty 90

## 2017-10-27 NOTE — Progress Notes (Signed)
PROGRESS NOTE                                                                                                                                                                                                             [name], is a 57 y.o. male, DOB - 01/03/1961, ZOX:[id number]  Admit date - 10/23/2017   Admitting Physician Standley Brooking, MD  Outpatient Primary MD for the patient is System, Provider Not In  LOS - 4  Outpatient Specialists: NONE  Chief Complaint  Patient presents with  . Jaundice       Brief Narrative   57 year old male with untreated hepatitis C with cirrhosis sent by his PCP for jaundice and found to have decompensated cirrhosis. GI evaluated the patient and suspect that his acute transaminitis and decompensated cirrhosis is likely from high dose gabapentin which was discontinued. Patient received 2 doses of Mucomyst. Seen by palliative care and patient made DO NOT RESUSCITATE with plan on hospice follow-up at assisted living.    Subjective:   Patient reports feeling weak. Denies any pain.  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 Started on rifaximin and lactulose. Gabapentin discontinued. Transaminases slowly trending down. Bilirubin minimally improved from admission.  Acute hepatitis panel shows positive hep C antibody.  No signs of encephalopathy.Tylenol level normal.  Received only 2 doses of Mucomyst (due to  miscommunication between nursing staff and respiratory therapist). GI discussed with poison control and recommended giving another loading dose of Mucomyst and 5 additional doses every 4 hours. Check LFTs and INR in the morning. Received last dose (third) of albumin this morning.  Palliative care consult  appreciated. Plan on returning to ALF with hospice follow-up.  Hyponatremia/hypo-chloremia Result. Lasix being held. Patient currently euvolemic.  Hypokalemia Replenished. Lasix on hold.   Dementia with traumatic brain injury Continue Aricept.   History of stroke with left hemiparesis  Chronic pain Avoid Tylenol and NSAIDs. Low-dose Fentanyl when necessary         Code Status : DO NOT RESUSCITATE  Family Communication  : None at bedside  Disposition Plan  : Return to ALF possibly tomorrow after completing course of Mucomyst and  LFTs in a.m.  Barriers For Discharge : Active symptoms  Consults  : GI  Procedures  : Ultrasound abdomen  DVT Prophylaxis  :  SCDs  Lab Results  Component Value Date   PLT 61 (L) 10/27/2017    Antibiotics  :    Anti-infectives (From admission, onward)   Start     Dose/Rate Route Frequency Ordered Stop   10/24/17 1800  rifaximin (XIFAXAN) tablet 550 mg     550 mg Oral 2 times daily 10/24/17 1446          Objective:   Vitals:   10/26/17 2003 10/26/17 2100 10/27/17 0618 10/27/17 0800  BP:  (!) 107/50 129/64   Pulse:  68 64   Resp:  18 18   Temp:  98.5 F (36.9 C) 98.4 F (36.9 C)   TempSrc:  Oral Oral   SpO2: 95% 93% 96% 96%  Weight:      Height:        Wt Readings from Last 3 Encounters:  10/23/17 104.7 kg (230 lb 13.2 oz)  02/27/17 102 kg (224 lb 12.8 oz)  10/28/16 101.2 kg (223 lb)     Intake/Output Summary (Last 24 hours) at 10/27/2017 1255 Last data filed at 10/26/2017 2100 Gross per 24 hour  Intake 720 ml  Output 350 ml  Net 370 ml     Physical Exam  Gen: not in distress, fatigued HEENT: Temporal wasting, no pallor, icterus, moist mucosa Chest: clear b/l, no added sounds CVS: N S1&S2, no murmurs,  GI: soft, NT, ND, BS+ Musculoskeletal: warm, no edema CNS: Alert and oriented, no tremors    Data Review:    CBC Recent Labs  Lab 10/23/17 1627 10/25/17 0638 10/26/17 0544 10/27/17 0424  WBC  8.0 6.8 5.0 4.6  HGB 13.7 12.2* 11.9* 11.4*  HCT 40.3 35.7* 35.1* 33.9*  PLT 77* 78* 55* 61*  MCV 94.2 92.7 93.6 94.4  MCH 32.0 31.7 31.7 31.8  MCHC 34.0 34.2 33.9 33.6  RDW 21.8* 21.5* 21.6* 22.2*  LYMPHSABS 1.1  --   --   --   MONOABS 1.3*  --   --   --   EOSABS 0.1  --   --   --   BASOSABS 0.0  --   --   --     Chemistries  Recent Labs  Lab 10/23/17 1627 10/24/17 0556 10/25/17 0638 10/26/17 0544 10/27/17 0424  NA 133* 132* 132* 138 135  K 4.1 4.2 3.3* 3.3* 3.7  CL 98* 98* 95* 100* 103  CO2 27 27 28 28 26   GLUCOSE 88 101* 65 77 81  BUN 18 18 17 19 17   CREATININE 1.08 0.99 0.92 0.96 0.81  CALCIUM 8.1* 7.9* 7.5* 7.9* 8.0*  MG  --   --   --  2.0  --   AST 300* 264*  262* 261* 291* 248*  ALT 135* 112*  113* 98* 93* 83*  ALKPHOS 377* 320*  338* 277* 253* 232*  BILITOT 11.8* 10.0*  10.1* 10.2* 9.7* 10.6*   ------------------------------------------------------------------------------------------------------------------ No results for input(s): CHOL, HDL, LDLCALC, TRIG, CHOLHDL, LDLDIRECT in the last 72 hours.  No results found for: HGBA1C ------------------------------------------------------------------------------------------------------------------ No results for input(s): TSH, T4TOTAL, T3FREE, THYROIDAB in the last 72 hours.  Invalid input(s): FREET3 ------------------------------------------------------------------------------------------------------------------ No results for input(s): VITAMINB12, FOLATE, FERRITIN, TIBC, IRON, RETICCTPCT in the last 72 hours.  Coagulation profile Recent Labs  Lab 10/23/17 1627 10/25/17 0638 10/27/17 0424  INR 1.45 1.58 1.67  No results for input(s): DDIMER in the last 72 hours.  Cardiac Enzymes No results for input(s): CKMB, TROPONINI, MYOGLOBIN in the last 168 hours.  Invalid input(s): CK ------------------------------------------------------------------------------------------------------------------ No results  found for: BNP  Inpatient Medications  Scheduled Meds: . acetylcysteine  140 mg/kg Oral Once  . acetylcysteine  70 mg/kg Oral Q4H  . benztropine  1 mg Oral BID  . carvedilol  3.125 mg Oral BID WC  . cycloSPORINE  1 drop Both Eyes BID  . donepezil  10 mg Oral QHS  . ipratropium-albuterol  3 mL Nebulization Q6H WA  . lactulose  20 g Oral BID  . rifaximin  550 mg Oral BID   Continuous Infusions: PRN Meds:.albuterol, diphenhydrAMINE, fentaNYL (SUBLIMAZE) injection  Micro Results No results found for this or any previous visit (from the past 240 hour(s)).  Radiology Reports Dg Ribs Unilateral W/chest Right  Result Date: 10/23/2017 CLINICAL DATA:  Cough with right upper quadrant pain EXAM: RIGHT RIBS AND CHEST - 3+ VIEW COMPARISON:  01/05/2016 FINDINGS: Single-view chest demonstrates no acute consolidation or effusion. Normal heart size. Aortic atherosclerosis. No pneumothorax. Right rib series demonstrates no definite acute displaced right rib fracture. Old appearing right tenth rib fracture. IMPRESSION: 1. Negative for pneumothorax or pleural effusion 2. No definite acute displaced right rib fracture Electronically Signed   By: Jasmine Pang M.D.   On: 10/23/2017 17:14   US Abdomen Complete  Result Date: 10/23/2017 CLINICAL DATA:  Right upper quadrant pain and distension. EXAM: ABDOMEN ULTRASOUND COMPLETE COMPARISON:  November 07, 2016 FINDINGS: Gallbladder: Gallstones are identified in the gallbladder. The gallbladder wall is thickened measuring 15 mm. There is pericholecystic fluid. The sonographer reports no sonographic Murphy sign. Common bile duct: Diameter: 3 mm Liver: There is increased echotexture of the liver with heterogeneity and nodular contour. No definite focal discrete liver lesion is noted. Portal vein is patent on color Doppler imaging with normal direction of blood flow towards the liver. IVC: No abnormality visualized. Pancreas: Not well visualized. Spleen: The spleen is  enlarged measuring 16.7 cm Right Kidney: Length: 11.8 cm. Echogenicity within normal limits. No mass or hydronephrosis visualized. Left Kidney: Length: 11.2 cm. Echogenicity within normal limits. No mass or hydronephrosis visualized. Abdominal aorta: No aneurysm visualized. Other findings: Minimal ascites is identified. IMPRESSION: Gallbladder wall thickening with gallstones and pericholecystic fluid. The sonographer reports no sonographic Murphy sign. The findings were seen on prior ultrasound and may be in part due to adjacent hepatocellular disease. However, cholecystitis cannot be excluded. Cirrhosis of liver.  Splenomegaly. Electronically Signed   By: Sherian Rein M.D.   On: 10/23/2017 17:47    Time Spent in minutes  25   Marjorie Lussier M.D on 10/27/2017 at 12:55 PM  Between 7am to 7pm - Pager - [phone]  After 7pm go to [email or web address] - password [facility]  [facility] -  Office  [phone]

## 2017-10-27 NOTE — Progress Notes (Signed)
Just took first dose of mucomyst in diet coke that Dr Gonzella Lexhungel ordered>  Dr. Darrick PennaFields dcd it.  Texted Dr. France Ravensnngel with this

## 2017-10-27 NOTE — Progress Notes (Signed)
Refused mucomyst/  Texted Lewie Loronnna Boone

## 2017-10-27 NOTE — Progress Notes (Signed)
    Subjective: No confusion. Reports lower back pain, no abdominal pain. Not much appetite for breakfast but drinking liquids.   Objective: Vital signs in last 24 hours: Temp:  [98.4 F (36.9 C)-98.7 F (37.1 C)] 98.4 F (36.9 C) (01/21 0618) Pulse Rate:  [64-71] 64 (01/21 0618) Resp:  [18] 18 (01/21 0618) BP: (107-129)/(50-64) 129/64 (01/21 0618) SpO2:  [93 %-96 %] 96 % (01/21 0618) Last BM Date: 10/26/17 General:   Alert and oriented, pleasant. Jaundiced  Head:  Normocephalic and atraumatic. Eyes:  +scleral icterus  Abdomen:  Bowel sounds present, soft, non-tender, non-distended. No HSM or hernias noted. No rebound or guarding. No masses appreciated  Extremities:  With Left pedal edema  Neurologic:  Alert and  oriented x4; negative asterixis  Psych:  Alert and cooperative. Normal mood and affect.  Intake/Output from previous day: 01/20 0701 - 01/21 0700 In: 1400 [P.O.:1200; IV Piggyback:200] Out: 350 [Urine:350] Intake/Output this shift: No intake/output data recorded.  Lab Results: Recent Labs    10/25/17 0638 10/26/17 0544 10/27/17 0424  WBC 6.8 5.0 4.6  HGB 12.2* 11.9* 11.4*  HCT 35.7* 35.1* 33.9*  PLT 78* 55* 61*   BMET Recent Labs    10/25/17 0638 10/26/17 0544 10/27/17 0424  NA 132* 138 135  K 3.3* 3.3* 3.7  CL 95* 100* 103  CO2 28 28 26   GLUCOSE 65 77 81  BUN 17 19 17   CREATININE 0.92 0.96 0.81  CALCIUM 7.5* 7.9* 8.0*   LFT Recent Labs    10/25/17 0638 10/26/17 0544 10/27/17 0424  PROT 6.1* 6.0* 6.1*  ALBUMIN 1.3* 1.6* 1.9*  AST 261* 291* 248*  ALT 98* 93* 83*  ALKPHOS 277* 253* 232*  BILITOT 10.2* 9.7* 10.6*   PT/INR Recent Labs    10/25/17 0638 10/27/17 0424  LABPROT 18.8* 19.5*  INR 1.58 1.67    Assessment: 57 year old male with history of cirrhosis secondary to ETOH abuse and chronic Hep C, admitted with decompensated liver disease. Elevated LFTs likely secondary to drug toxicity (started on Neurontin several months ago),  taking tylenol once per day. Mucomyst 140 mg/kg (14,660 mg) given once on 1/18, and was to be given 70 mg/kg for 17 total doses. He received the initial loading dose, and then only received a dose on 1/20. [facility], who verified this. Also discussed with poison control today. Recommended repeat loading dose of mucomyst (140 mg/kg) and give an additional 5 doses every 4 hours (70 mg/kg), with repeat LFTs in the morning. I discussed this with nursing staff and asked to contact us if patient refuses, as there was some concern over him refusing overnight.   Transaminases slowly trending down, bilirubin lagging but down from admission. Acute hepatitis panel with positive Hep C antibody, otherwise negative. MELD Na 23, Child Pugh class C. No confusion today and without asterixis on exam.   Plan: Repeat INR, CMP tomorrow Complete course of mucomyst as originally recommended, but will need loading dose now and then 5 additional doses every 4 hours.  Lactulose BID, Xifaxan 550 mg BID Last dose of albumin this morning Discussed with nursing mucomyst administration  Will continue to follow with you   Gelene MinkAnna W. Sitara Cashwell, PhD, ANP-BC [facility]     LOS: 4 days    10/27/2017, 7:50 AM

## 2017-10-27 NOTE — Care Management Note (Signed)
Case Management Note  Patient Details  Name: Jared FitchMichael Ferran MRN: [id number] Date of Birth: 01/01/1961  Subjective/Objective:     Admitted with decompensated Cirrhosis. [facility]. Palliative has seen pt and plan is to return to ALF with [facility]. CM has given Erick Colaceharles Alston, [facility] rep, the referral, he will pull pt info from chart. Anticipate DC back to ALF tomorrow. CSW will make arrangements for return to facility. CM will verify DC with [facility] and DC summary will be pulled once available.                Expected Discharge Date:  10/26/17               Expected Discharge Plan:  Assisted Living / Rest Home(with hospice care)  In-House Referral:  Clinical Social Work  Discharge planning Services  CM Consult  Post Acute Care Choice:  Hospice Choice offered to:  Patient  HH Arranged:  RN HH Agency:  Other - See comment([facility])  Status of Service:  Completed, signed off  Malcolm MetroChildress, Colbert Curenton Demske, RN 10/27/2017, 1:28 PM

## 2017-10-28 ENCOUNTER — Other Ambulatory Visit: Payer: Self-pay

## 2017-10-28 ENCOUNTER — Telehealth: Payer: Self-pay | Admitting: Gastroenterology

## 2017-10-28 DIAGNOSIS — B1920 Unspecified viral hepatitis C without hepatic coma: Secondary | ICD-10-CM

## 2017-10-28 DIAGNOSIS — K7469 Other cirrhosis of liver: Principal | ICD-10-CM

## 2017-10-28 LAB — COMPREHENSIVE METABOLIC PANEL
ALBUMIN: 2.1 g/dL — AB (ref 3.5–5.0)
ALK PHOS: 206 U/L — AB (ref 38–126)
ALT: 67 U/L — ABNORMAL HIGH (ref 17–63)
ANION GAP: 9 (ref 5–15)
AST: 181 U/L — ABNORMAL HIGH (ref 15–41)
BILIRUBIN TOTAL: 11.6 mg/dL — AB (ref 0.3–1.2)
BUN: 15 mg/dL (ref 6–20)
CALCIUM: 8.1 mg/dL — AB (ref 8.9–10.3)
CO2: 22 mmol/L (ref 22–32)
Chloride: 103 mmol/L (ref 101–111)
Creatinine, Ser: 0.65 mg/dL (ref 0.61–1.24)
GFR calc Af Amer: >60 mL/min (ref >60)
GFR calc non Af Amer: >60 mL/min (ref >60)
GLUCOSE: 90 mg/dL (ref 65–99)
Potassium: 3.6 mmol/L (ref 3.5–5.1)
Sodium: 134 mmol/L — ABNORMAL LOW (ref 135–145)
TOTAL PROTEIN: 6.3 g/dL — AB (ref 6.5–8.1)

## 2017-10-28 LAB — PROTIME-INR
INR: 1.75
Prothrombin Time: 20.2 seconds — ABNORMAL HIGH (ref 11.4–15.2)

## 2017-10-28 MED ORDER — IPRATROPIUM-ALBUTEROL 0.5-2.5 (3) MG/3ML IN SOLN
3.0000 mL | Freq: Three times a day (TID) | RESPIRATORY_TRACT | Status: DC
  Administered 2017-10-28 (×2): 3 mL via RESPIRATORY_TRACT
  Filled 2017-10-28 (×3): qty 3

## 2017-10-28 MED ORDER — OXYCODONE HCL 5 MG PO TABS
5.0000 mg | ORAL_TABLET | Freq: Four times a day (QID) | ORAL | Status: DC | PRN
Start: 2017-10-28 — End: 2017-10-29

## 2017-10-28 NOTE — Clinical Social Work Note (Signed)
Patient's discharge clinicals sent to facility. Catina at the facility notified that patient could be pick up. LCSW was previously informed by Joyce GrossKay that staff had assessed patient on 10/27/17 and deemed that he could return to the facility.  LCSW signing off.    Sorah Falkenstein, Juleen ChinaHeather D, LCSW

## 2017-10-28 NOTE — Telephone Encounter (Signed)
Jared Frye: patient is in the hospital but soon to be discharged. Please have facility complete CMP and INR on 1/30 and fax to us. ([facility]).

## 2017-10-28 NOTE — Clinical Social Work Note (Signed)
Clinical Social Work Assessment  Patient Details  Name: Jared Frye MRN: [id number] Date of Birth: [birth year]  Date of referral:  10/28/17               Reason for consult:  Other (Comment Required)(From [facility])                Permission sought to share information with:    Permission granted to share information::     Name::        Agency::  Joyce GrossKay at [facility]   Relationship::     Contact Information:     Housing/Transportation Living arrangements for the past 2 months:  [facility] Source of Information:  Patient Patient Interpreter Needed:  None Criminal Activity/Legal Involvement Pertinent to Current Situation/Hospitalization:  No - Comment as needed Significant Relationships:  None Lives with:    Do you feel safe going back to the place where you live?  Yes Need for family participation in patient care:  No (Coment)(Patient has no family supports)  Care giving concerns:  None identified.    Social Worker assessment / plan:  Patient has been a [facility] for four years. He is independent and requires minimal assistance from staff. Patient is only able to utilize one side of his extremities per [facility]. Patient can return at discharge.   Employment status:  Disabled (Comment on whether or not currently receiving Disability) Insurance information:  [place]  Not assessed at this time Information / Referral to community resources:     Patient/Family's Response to care:  Patient is a long term resident at [facility].   Patient/Family's Understanding of and Emotional Response to Diagnosis, Current Treatment, and Prognosis:  Patient understands his diagnosis, treatment and prognosis and will return to the facility with hospice.   Emotional Assessment Appearance:  Appears stated age Attitude/Demeanor/Rapport:    Affect (typically observed):  Unable to Assess Orientation:  Oriented to Self,  Oriented to Place, Oriented to  Time, Oriented to Situation Alcohol / Substance use:  Never Used Psych involvement (Current and /or in the community):  No (Comment)  Discharge Needs  Concerns to be addressed:  Other (Comment Required([facility] at discharge) Readmission within the last 30 days:  No Current discharge risk:  None Barriers to Discharge:  No Barriers Identified   Annice NeedySettle, Vashti Bolanos D, LCSW 10/28/2017, 12:27 PM

## 2017-10-28 NOTE — Progress Notes (Signed)
[facility] because patient had been waiting for transport for hours. They told me that they had already sent all their transportation team home and they had no one left to come after the patient. I informed the doctor and the night shift nurse of the situation. I also briefly spoke with the patient, he is aware of what is going on.

## 2017-10-28 NOTE — Progress Notes (Signed)
Pt was to discharge today, 1/22, during day shift, no transportation available for pt. Day shift RN made MD aware. Pt's IV removed during day shift, this RN made MD aware of loss of access and requested PO pain medication.

## 2017-10-28 NOTE — Care Management Note (Signed)
Case Management Note  Patient Details  Name: Katy FitchMichael Bocek MRN: [id number] Date of Birth: 09-10-1961  Expected Discharge Date:  10/28/17               Expected Discharge Plan:  Assisted Living / Rest Home(with hospice care)  In-House Referral:  Clinical Social Work  Discharge planning Services  CM Consult  Post Acute Care Choice:  Hospice Choice offered to:  Patient  DME Arranged:    DME Agency:     HH Arranged:  RN HH Agency:  Other - See comment([facility])  Status of Service:  Completed, signed off  If discussed at Long Length of Stay Meetings, dates discussed:  10/28/2017  Additional Comments: Pt discharging to ALF today. DC summary faxed to hospice provider. CSW has made arrangements for return to facility.   Malcolm Metrohildress, Maree Ainley Demske, RN 10/28/2017, 1:29 PM

## 2017-10-28 NOTE — Progress Notes (Signed)
    Subjective: No confusion or mental status changes. Feels tired and did not rest well overnight. Doesn't want to eat breakfast. Laying on side and declining physical exam.  Objective: Vital signs in last 24 hours: Temp:  [97.8 F (36.6 C)-99 F (37.2 C)] 98.4 F (36.9 C) (01/22 0700) Pulse Rate:  [61-94] 94 (01/22 0700) Resp:  [16-18] 16 (01/22 0700) BP: (114-143)/(53-74) 115/60 (01/22 0700) SpO2:  [92 %-97 %] 96 % (01/22 0700) Last BM Date: 10/27/17 General:   Alert and oriented, flat affect, jaundiced  Head:  Normocephalic and atraumatic. Eyes:  +scleral icterus  Mouth:  Without lesions, mucosa pink and moist.  Neck:  Supple, without thyromegaly or masses.  Abdomen:  Declined physical exam. Laying on side. Unable to assess  Extremities:  With pedal edema  Neurologic:  Alert and  oriented x4   Intake/Output from previous day: 01/21 0701 - 01/22 0700 In: 480 [P.O.:480] Out: 550 [Urine:550] Intake/Output this shift: Total I/O In: -  Out: 300 [Urine:300]  Lab Results: Recent Labs    10/26/17 0544 10/27/17 0424  WBC 5.0 4.6  HGB 11.9* 11.4*  HCT 35.1* 33.9*  PLT 55* 61*   BMET Recent Labs    10/26/17 0544 10/27/17 0424 10/28/17 0517  NA 138 135 134*  K 3.3* 3.7 3.6  CL 100* 103 103  CO2 28 26 22   GLUCOSE 77 81 90  BUN 19 17 15   CREATININE 0.96 0.81 0.65  CALCIUM 7.9* 8.0* 8.1*   LFT Recent Labs    10/26/17 0544 10/27/17 0424 10/28/17 0517  PROT 6.0* 6.1* 6.3*  ALBUMIN 1.6* 1.9* 2.1*  AST 291* 248* 181*  ALT 93* 83* 67*  ALKPHOS 253* 232* 206*  BILITOT 9.7* 10.6* 11.6*   PT/INR Recent Labs    10/27/17 0424 10/28/17 0517  LABPROT 19.5* 20.2*  INR 1.67 1.75    Assessment: 57 year old male with history of cirrhosis secondary to ETOH abuse and chronic Hep C, admitted with decompensated liver disease. Elevated LFTs likely secondary to drug toxicity (started on Neurontin several months ago), taking tylenol once per day. Transaminases  trending down with bilirubin remaining elevated. Acute hepatitis panel with positive Hep C antibody as already know, otherwise negative. MELD Na 25 today, increased. Child Pugh class C. No confusion or mental status changes, and he is declining physical exam today.   Overall, poor prognosis. Plan to return to assisted living facility with [facility] as per Care Management note on 1/21.   Plan: Needs HFP, INR on 1/30: we will arrange Continue lactulose and Xifaxan Avoid Neurontin    Gelene MinkAnna W. Ozell Ferrera, PhD, ANP-BC [facility]    LOS: 5 days    10/28/2017, 7:59 AM

## 2017-10-28 NOTE — Telephone Encounter (Signed)
I have called Laurelyn [facility]leKatina at [facility] [phone]( [phone]).  She is aware of the lab request and I have faxed the orders to her @ [phone].

## 2017-10-28 NOTE — Progress Notes (Signed)
Social worker reports that AVS has been faxed to [facility]. IV removed, patient tolerated procedure well. [facility].

## 2017-10-28 NOTE — Discharge Summary (Signed)
Physician Discharge Summary      Patient: Jared Frye                   Admit date: 10/23/2017   DOB: 1961/06/02             Discharge date:10/28/2017/1:04 PM OZH:[id number]                           PCP: System, Provider Not In Recommendations for Outpatient Follow-up:   Palliative care consult appreciated. 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 Jared Frye is a  57 year old male with untreated hepatitis C with cirrhosis sent by his PCP for jaundice and found to have decompensated cirrhosis. GI evaluated the patient and suspect that his acute transaminitis and decompensated cirrhosis is likely from high dose gabapentin which was discontinued. Patient received 2 doses of Mucomyst. Seen by palliative care and patient made DO NOT RESUSCITATE with plan on hospice follow-up at assisted living.  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 Was Started on rifaximin and lactulose. Gabapentin discontinued. Transaminases slowly trending down. Bilirubin minimally improved from admission.  Acute hepatitis panel shows positive hep C antibody.  No signs of encephalopathy.Tylenol level normal.  Post aggressive treatment with oral  Mucomyst,   GI discussed with poison control and recommended giving another loading dose of Mucomyst and 5 additional doses every 4 hours -which she has completed. Improving LFTs, INR 1.75 today Received last dose (third) of albumin.   Hyponatremia/hypo-chloremia Proved, resume home medication.  Hypokalemia Replenished.   Dementia with traumatic brain injury Continue Aricept.  History of stroke with left hemiparesis -no changes generalized weaknesses Chronic pain Avoid Tylenol and NSAIDs.  Code Status : DO NOT RESUSCITATE  Family Communication : No family visit on this admission Disposition Plan  : Return to ALF, with hospice following   Consultations:   Palliative/hospice  Procedures: No admission procedures for hospital encounter.    ----------------------------------------------------------------------------------------------------------------------  Discharge Instructions:   Discharge Instructions    Activity as tolerated - No restrictions   Complete by:  As directed    Call MD for:  difficulty breathing, headache or visual disturbances   Complete by:  As directed    Call MD for:  temperature >100.4   Complete by:  As directed    Diet - low sodium heart healthy   Complete by:  As directed    Discharge instructions   Complete by:  As directed    He is to not take anymore of gabapentin, avoid any medication or hepatotoxic, including Tylenol   Increase activity slowly   Complete by:  As directed        Medication List    STOP taking these medications   acetaminophen 500 MG tablet Commonly known as:  TYLENOL   gabapentin 300 MG capsule  Commonly known as:  NEURONTIN     TAKE these medications   alclomethasone 0.05 % cream Commonly known as:  ACLOVATE Apply topically 2 (two) times daily. Rash on face   benztropine 1 MG tablet Commonly known as:  COGENTIN Take 1 mg by mouth 2 (two) times daily.   Calcium-D 600-400 MG-UNIT Tabs Take 1  tablet by mouth 2 (two) times daily.   carvedilol 3.125 MG tablet Commonly known as:  COREG Take 3.125 mg by mouth 2 (two) times daily with a meal.   CREON 24000-76000 units Cpep Generic drug:  Pancrelipase (Lip-Prot-Amyl) Take 1 capsule by mouth 3 (three) times daily.   cycloSPORINE 0.05 % ophthalmic emulsion Commonly known as:  RESTASIS Place 1 drop into both eyes 2 (two) times daily.   donepezil 10 MG tablet Commonly known as:  ARICEPT Take 10 mg by mouth at bedtime.   folic acid 1 MG tablet Commonly known as:  FOLVITE Take 1 mg by mouth daily.   furosemide 80 MG tablet Commonly known as:  LASIX Take 80 mg by mouth daily.   guaifenesin 100 MG/5ML syrup Commonly known as:  ROBITUSSIN Take 200 mg by mouth every 6 (six) hours as needed for cough.   ipratropium 17 MCG/ACT inhaler Commonly known as:  ATROVENT HFA Inhale 1 puff into the lungs every 6 (six) hours as needed for wheezing.   Ipratropium-Albuterol 20-100 MCG/ACT Aers respimat Commonly known as:  COMBIVENT Inhale 1 puff into the lungs every 6 (six) hours as needed for wheezing.   lactulose (encephalopathy) 10 GM/15ML Soln Commonly known as:  CHRONULAC Take 45 mLs (30 g total) by mouth 4 (four) times daily as needed (IF NEEDED TO PREVENT CONSTIPATION). PT takes 30 ml  Four times daily What changed:    how much to take  when to take this  additional instructions   loperamide 2 MG capsule Commonly known as:  IMODIUM Take 2 mg by mouth as needed for diarrhea or loose stools.   Melatonin 3 MG Tabs Take 1 tablet by mouth at bedtime.   mirtazapine 30 MG tablet Commonly known as:  REMERON Take 30 mg by mouth at bedtime.   multivitamin tablet Take 1 tablet by mouth daily.   neomycin-bacitracin-polymyxin ointment Commonly known as:  NEOSPORIN Apply 1 application topically daily as needed for wound care.   omeprazole 20 MG capsule Commonly known as:  PRILOSEC 1 po bid 30 minutes before meals for 3 mos  then once daily FOREVER What changed:    how much to take  how to take this  when to take this  additional instructions   potassium chloride 20 MEQ packet Commonly known as:  KLOR-CON Take 20 mEq by mouth daily.   PROAIR HFA 108 (90 Base) MCG/ACT inhaler Generic drug:  albuterol Inhale 1 puff into the lungs every 6 (six) hours as needed for wheezing or shortness of breath.   QUEtiapine 25 MG tablet Commonly known as:  SEROQUEL Take 50 mg by mouth 2 (two) times daily.   rifaximin 550 MG Tabs tablet Commonly known as:  XIFAXAN Take 1 tablet (550 mg total) by mouth 2 (two) times daily.   rOPINIRole 2 MG tablet Commonly known as:  REQUIP Take 2 mg by mouth at bedtime.   spironolactone 50 MG tablet Commonly known as:  ALDACTONE Take 50 mg by mouth daily.   traMADol 50 MG tablet Commonly known as:  ULTRAM Take 50 mg by mouth 3 (three) times daily.  No Known Allergies    Procedures/Studies: Dg Ribs Unilateral W/chest Right  Result Date: 10/23/2017 CLINICAL DATA:  Cough with right upper quadrant pain EXAM: RIGHT RIBS AND CHEST - 3+ VIEW COMPARISON:  01/05/2016 FINDINGS: Single-view chest demonstrates no acute consolidation or effusion. Normal heart size. Aortic atherosclerosis. No pneumothorax. Right rib series demonstrates no definite acute displaced right rib fracture. Old appearing right tenth rib fracture. IMPRESSION: 1. Negative for pneumothorax or pleural effusion 2. No definite acute displaced right rib fracture Electronically Signed   By: Jasmine Pang M.D.   On: 10/23/2017 17:14   US Abdomen Complete  Result Date: 10/23/2017 CLINICAL DATA:  Right upper quadrant pain and distension. EXAM: ABDOMEN ULTRASOUND COMPLETE COMPARISON:  November 07, 2016 FINDINGS: Gallbladder: Gallstones are identified in the gallbladder. The gallbladder wall is thickened measuring 15 mm. There is pericholecystic fluid. The sonographer reports no sonographic Murphy sign. Common bile  duct: Diameter: 3 mm Liver: There is increased echotexture of the liver with heterogeneity and nodular contour. No definite focal discrete liver lesion is noted. Portal vein is patent on color Doppler imaging with normal direction of blood flow towards the liver. IVC: No abnormality visualized. Pancreas: Not well visualized. Spleen: The spleen is enlarged measuring 16.7 cm Right Kidney: Length: 11.8 cm. Echogenicity within normal limits. No mass or hydronephrosis visualized. Left Kidney: Length: 11.2 cm. Echogenicity within normal limits. No mass or hydronephrosis visualized. Abdominal aorta: No aneurysm visualized. Other findings: Minimal ascites is identified. IMPRESSION: Gallbladder wall thickening with gallstones and pericholecystic fluid. The sonographer reports no sonographic Murphy sign. The findings were seen on prior ultrasound and may be in part due to adjacent hepatocellular disease. However, cholecystitis cannot be excluded. Cirrhosis of liver.  Splenomegaly. Electronically Signed   By: Sherian Rein M.D.   On: 10/23/2017 17:47      Subjective: Patient was seen and examined 10/28/2017, 1:04 PM Patient stable  Today. No acute distress.  No issues overnight Stable for discharge.  Discharge Exam:  Vitals:   10/27/17 2016 10/27/17 2054 10/28/17 0140 10/28/17 0700  BP:  (!) 114/53  115/60  Pulse:  61  94  Resp:  18  16  Temp:  99 F (37.2 C)  98.4 F (36.9 C)  TempSrc:  Oral  Oral  SpO2: 92% 96% 96% 96%  Weight:      Height:        General: Pt lying comfortably in bed & appears in no obvious distress. Cardiovascular: S1 & S2 heard, RRR, S1/S2 +. No murmurs, rubs, gallops or clicks. No JVD or pedal edema. Respiratory: Clear to auscultation without wheezing, rhonchi or crackles. No increased work of breathing. Abdominal:  Non distended, non tender & soft. No organomegaly or masses appreciated. Normal bowel sounds heard. CNS: Alert and oriented. No focal deficits. Extremities: Be a  generalized weaknesses, guilty ambulating no edema, no cyanosis    The results of significant diagnostics from this hospitalization (including imaging, microbiology, ancillary and laboratory) are listed below for reference.     Microbiology: No results found for this or any previous visit (from the past 240 hour(s)).   Labs: CBC: Recent Labs  Lab 10/23/17 1627 10/25/17 0638 10/26/17 0544 10/27/17 0424  WBC 8.0 6.8 5.0 4.6  NEUTROABS 5.5  --   --   --   HGB 13.7 12.2* 11.9* 11.4*  HCT 40.3 35.7* 35.1* 33.9*  MCV 94.2 92.7 93.6 94.4  PLT 77* 78* 55* 61*   Basic Metabolic Panel: Recent Labs  Lab  10/24/17 0556 10/25/17 1610 10/26/17 0544 10/27/17 0424 10/28/17 0517  NA 132* 132* 138 135 134*  K 4.2 3.3* 3.3* 3.7 3.6  CL 98* 95* 100* 103 103  CO2 27 28 28 26 22   GLUCOSE 101* 65 77 81 90  BUN 18 17 19 17 15   CREATININE 0.99 0.92 0.96 0.81 0.65  CALCIUM 7.9* 7.5* 7.9* 8.0* 8.1*  MG  --   --  2.0  --   --    Liver Function Tests: Recent Labs  Lab 10/24/17 0556 10/25/17 0638 10/26/17 0544 10/27/17 0424 10/28/17 0517  AST 264*  262* 261* 291* 248* 181*  ALT 112*  113* 98* 93* 83* 67*  ALKPHOS 320*  338* 277* 253* 232* 206*  BILITOT 10.0*  10.1* 10.2* 9.7* 10.6* 11.6*  PROT 6.6  6.6 6.1* 6.0* 6.1* 6.3*  ALBUMIN 1.6*  1.5* 1.3* 1.6* 1.9* 2.1*   BNP (last 3 results) No results for input(s): BNP in the last 8760 hours. Cardiac Enzymes: No results for input(s): CKTOTAL, CKMB, CKMBINDEX, TROPONINI in the last 168 hours. CBG: No results for input(s): GLUCAP in the last 168 hours. Hgb A1c No results for input(s): HGBA1C in the last 72 hours. Lipid Profile No results for input(s): CHOL, HDL, LDLCALC, TRIG, CHOLHDL, LDLDIRECT in the last 72 hours. Thyroid function studies No results for input(s): TSH, T4TOTAL, T3FREE, THYROIDAB in the last 72 hours.  Invalid input(s): FREET3 Anemia work up No results for input(s): VITAMINB12, FOLATE, FERRITIN, TIBC, IRON,  RETICCTPCT in the last 72 hours. Urinalysis    Component Value Date/Time   COLORURINE YELLOW 01/05/2016 1044   APPEARANCEUR CLEAR 01/05/2016 1044   LABSPEC 1.005 01/05/2016 1044   PHURINE 8.0 01/05/2016 1044   GLUCOSEU NEGATIVE 01/05/2016 1044   HGBUR NEGATIVE 01/05/2016 1044   BILIRUBINUR NEGATIVE 01/05/2016 1044   KETONESUR NEGATIVE 01/05/2016 1044   PROTEINUR NEGATIVE 01/05/2016 1044   NITRITE NEGATIVE 01/05/2016 1044   LEUKOCYTESUR NEGATIVE 01/05/2016 1044      Time coordinating discharge: Over 30 minutes  SIGNED: Kendell Bane, MD, FACP, FHM. [facility] Pager [phone]  If 7PM-7AM, please contact night-coverage [email or web address] Password [facility] 10/28/2017, 1:04 PM

## 2017-10-28 NOTE — NC FL2 (Signed)
[place] MEDICAID FL2 LEVEL OF CARE SCREENING TOOL     IDENTIFICATION  Patient Name: Jared Frye Birthdate: [birth year] Sex: male Admission Date (Current Location): 10/23/2017  [place] and [place] Number:  Jared Frye [id number] [facility] Facility and Address:  [facility],  [address] [place] [address]      Provider Number: [id number]  Attending Physician Name and Address:  Kendell Bane, MD  Relative Name and Phone Number:       Current Level of Care: Hospital Recommended Level of Care: Assisted Living Facility Prior Approval Number:    Date Approved/Denied:   PASRR Number:    Discharge Plan: Other (Comment)([facility])    Current Diagnoses: Patient Active Problem List   Diagnosis Date Noted  . Jaundice   . Elevated bilirubin   . Palliative care encounter   . Goals of care, counseling/discussion   . Encounter for hospice care discussion   . DNR (do not resuscitate) discussion   . 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 Thrombocytopenia (HCC) 10/23/2017  . Hyponatremia 10/23/2017  . Hyperammonemia (HCC) 10/23/2017  . Constipation 05/01/2016  . Cirrhosis (HCC) 01/30/2016  . Hepatitis C virus infection without hepatic coma 01/30/2016    Orientation RESPIRATION BLADDER Height & Weight     Self, Time, Situation, Place  Normal Continent Weight: 230 lb 13.2 oz (104.7 kg) Height:  6' (182.9 cm)  BEHAVIORAL SYMPTOMS/MOOD NEUROLOGICAL BOWEL NUTRITION STATUS      Continent (Heart Healthy)  AMBULATORY STATUS COMMUNICATION OF NEEDS Skin   Limited Assist Verbally Normal                       Personal Care Assistance Level of Assistance  Feeding, Bathing, Dressing Bathing Assistance: Limited assistance Feeding assistance: Independent Dressing Assistance: Limited assistance     Functional Limitations Info  Sight, Hearing, Speech Sight Info: Adequate Hearing Info: Adequate Speech Info: Adequate     SPECIAL CARE FACTORS FREQUENCY                       Contractures Contractures Info: Not present    Additional Factors Info  Code Status, Psychotropic Code Status Info: DNR   Psychotropic Info: Remeron, Seroquel         Current Medications (10/28/2017):  This is the current hospital active medication list Current Facility-Administered Medications  Medication Dose Route Frequency Provider Last Rate Last Dose  . albuterol (PROVENTIL) (2.5 MG/3ML) 0.083% nebulizer solution 2.5 mg  2.5 mg Nebulization Q2H PRN Standley Brooking, MD      . benztropine (COGENTIN) tablet 1 mg  1 mg Oral BID Standley Brooking, MD   1 mg at 10/28/17 0840  . carvedilol (COREG) tablet 3.125 mg  3.125 mg Oral BID WC Standley Brooking, MD   3.125 mg at 10/28/17 0841  . cycloSPORINE (RESTASIS) 0.05 % ophthalmic emulsion 1 drop  1 drop Both Eyes BID Standley Brooking, MD   1 drop at 10/28/17 0841  . diphenhydrAMINE (BENADRYL) capsule 25 mg  25 mg Oral Q6H PRN Maurilio Lovely D, DO   25 mg at 10/28/17 0714  . donepezil (ARICEPT) tablet 10 mg  10 mg Oral QHS Standley Brooking, MD   10 mg at 10/27/17 2206  . fentaNYL (SUBLIMAZE) injection 25 mcg  25 mcg Intravenous Q4H PRN Fields, Sandi L, MD   25 mcg at 10/28/17 1130  . ipratropium-albuterol (DUONEB) 0.5-2.5 (3) MG/3ML nebulizer  solution 3 mL  3 mL Nebulization TID Johnson, Clanford L, MD      . lactulose (CHRONULAC) 10 GM/15ML solution 20 g  20 g Oral BID Lionel Decemberehman, Najeeb U, MD   20 g at 10/28/17 0840  . rifaximin (XIFAXAN) tablet 550 mg  550 mg Oral BID Darrick PennaFields, Sandi L, MD   550 mg at 10/28/17 0841     Discharge Medications: TAKE these medications   alclomethasone 0.05 % cream Commonly known as:  ACLOVATE Apply topically 2 (two) times daily. Rash on face   benztropine 1 MG tablet Commonly known as:  COGENTIN Take 1 mg by mouth 2 (two) times daily.   Calcium-D 600-400 MG-UNIT Tabs Take 1 tablet by mouth 2 (two) times daily.   carvedilol  3.125 MG tablet Commonly known as:  COREG Take 3.125 mg by mouth 2 (two) times daily with a meal.   CREON 24000-76000 units Cpep Generic drug:  Pancrelipase (Lip-Prot-Amyl) Take 1 capsule by mouth 3 (three) times daily.   cycloSPORINE 0.05 % ophthalmic emulsion Commonly known as:  RESTASIS Place 1 drop into both eyes 2 (two) times daily.   donepezil 10 MG tablet Commonly known as:  ARICEPT Take 10 mg by mouth at bedtime.   folic acid 1 MG tablet Commonly known as:  FOLVITE Take 1 mg by mouth daily.   furosemide 80 MG tablet Commonly known as:  LASIX Take 80 mg by mouth daily.   guaifenesin 100 MG/5ML syrup Commonly known as:  ROBITUSSIN Take 200 mg by mouth every 6 (six) hours as needed for cough.   ipratropium 17 MCG/ACT inhaler Commonly known as:  ATROVENT HFA Inhale 1 puff into the lungs every 6 (six) hours as needed for wheezing.   Ipratropium-Albuterol 20-100 MCG/ACT Aers respimat Commonly known as:  COMBIVENT Inhale 1 puff into the lungs every 6 (six) hours as needed for wheezing.   lactulose (encephalopathy) 10 GM/15ML Soln Commonly known as:  CHRONULAC Take 45 mLs (30 g total) by mouth 4 (four) times daily as needed (IF NEEDED TO PREVENT CONSTIPATION). PT takes 30 ml  Four times daily What changed:    how much to take  when to take this  additional instructions   loperamide 2 MG capsule Commonly known as:  IMODIUM Take 2 mg by mouth as needed for diarrhea or loose stools.   Melatonin 3 MG Tabs Take 1 tablet by mouth at bedtime.   mirtazapine 30 MG tablet Commonly known as:  REMERON Take 30 mg by mouth at bedtime.   multivitamin tablet Take 1 tablet by mouth daily.   neomycin-bacitracin-polymyxin ointment Commonly known as:  NEOSPORIN Apply 1 application topically daily as needed for wound care.   omeprazole 20 MG capsule Commonly known as:  PRILOSEC 1 po bid 30 minutes before meals for 3 mos then once daily FOREVER What  changed:    how much to take  how to take this  when to take this  additional instructions   potassium chloride 20 MEQ packet Commonly known as:  KLOR-CON Take 20 mEq by mouth daily.   PROAIR HFA 108 (90 Base) MCG/ACT inhaler Generic drug:  albuterol Inhale 1 puff into the lungs every 6 (six) hours as needed for wheezing or shortness of breath.   QUEtiapine 25 MG tablet Commonly known as:  SEROQUEL Take 50 mg by mouth 2 (two) times daily.   rifaximin 550 MG Tabs tablet Commonly known as:  XIFAXAN Take 1 tablet (550 mg total) by mouth 2 (  two) times daily.   rOPINIRole 2 MG tablet Commonly known as:  REQUIP Take 2 mg by mouth at bedtime.   spironolactone 50 MG tablet Commonly known as:  ALDACTONE Take 50 mg by mouth daily.   traMADol 50 MG tablet Commonly known as:  ULTRAM Take 50 mg by mouth 3 (three) times daily.         Relevant Imaging Results:  Relevant Lab Results:   Additional Information    Modest Draeger, Juleen China, LCSW

## 2017-10-29 NOTE — Progress Notes (Signed)
Late entry  Super[facility] called to inform me again that pt did not have transportation back to facility, I told her I was aware of this and that pt would spend the night here but to please ensure transportation for 10/29/17 as pt should have been discharged on 10/28/17. Supervisor assured me that transportation would be available on 1/23.

## 2017-10-29 NOTE — Progress Notes (Signed)
CC'D TO PCP °

## 2017-10-29 NOTE — Progress Notes (Signed)
[facility] called and asked about transportation, Aram BeechamCynthia reported to this nurse, patient in on their list to be picked up this morning.

## 2017-10-29 NOTE — Progress Notes (Signed)
Called [facility] this morning in regards to transportation for the patient going back to the facility. It was reported to this nurse, once transportation comes into work today, [facility] will be contacted.

## 2017-11-06 NOTE — Telephone Encounter (Signed)
I called [facility] and spoke to [place]. She checked and said she did not have any lab results. I asked several times to speak with Laurelyn SickleKatina and she would never get her for me. She first told me the lady was there today and drew blood. I asked if this pt had labs drawn and she said she did not know. Then she came back and told me they had no orders for blood work. I told her I spoke with [name] last week on 10/28/2017 and told her what was needed and faxed the orders and she should have called if she did not receive them. She said now it will be next Wed before this can be done. I have refaxed the orders and asked them to call me when it is received.

## 2017-11-06 NOTE — Telephone Encounter (Signed)
I called to speak with Jared Frye, was told she has left for the day. Spoke to [name] and she said she received the lab orders and pt is set up to have labs on Wed  Next week.

## 2017-11-06 NOTE — Telephone Encounter (Signed)
Doris: can we see if the [facility] at [facility] yesterday? Thanks!

## 2017-11-12 LAB — PROTIME-INR

## 2017-11-14 NOTE — Telephone Encounter (Signed)
Can we check on labs? Thanks!

## 2017-11-17 NOTE — Telephone Encounter (Signed)
Lab results received and placed on [name]'s desk for review.

## 2017-11-17 NOTE — Telephone Encounter (Signed)
REFERRAL TO HOSPICE JAN 2019. REVIEWED-NO ADDITIONAL RECOMMENDATIONS.

## 2017-11-17 NOTE — Telephone Encounter (Signed)
[facility] and she said the labs were done last week and she will fax to me.

## 2017-11-17 NOTE — Telephone Encounter (Signed)
Outside labs reviewed dated 11/12/17 (we have had to request several times and was difficult to actually have blood work completed as originally planned. See multiple phone notes).   Tbili 10.2, AST 205, ALT 67, Alk Phos 336 Sodium 135, Creatinine 0.74, INR 1.8.   MELD Na 24. Bilirubin just mildly decreased from last on file 1/22. ALT same, overall improved from initial presentation to hospital. AST increased from time of discharge but still better than initial presentation. INR slightly increased (was 1.75).   Can we verify if patient is on hospice services? I don't see where he has a follow-up here. Routing to Dr. Oneida Alar as an Juluis Rainier.

## 2017-11-18 NOTE — Telephone Encounter (Signed)
Noted  

## 2018-02-04 DEATH — deceased

## 2018-03-12 ENCOUNTER — Encounter: Payer: Self-pay | Admitting: Gastroenterology

## 2018-07-28 IMAGING — US US ABDOMEN COMPLETE
1 series · 13 of 25 positions shown · non-contrast
Comparison: November 07, 2016

CLINICAL DATA: Right upper quadrant pain and distension.

EXAM:
ABDOMEN ULTRASOUND COMPLETE

[Series 1: us abdomen complete · 0.28mm/px · 13 of 108 slices shown]
[im 1/108]
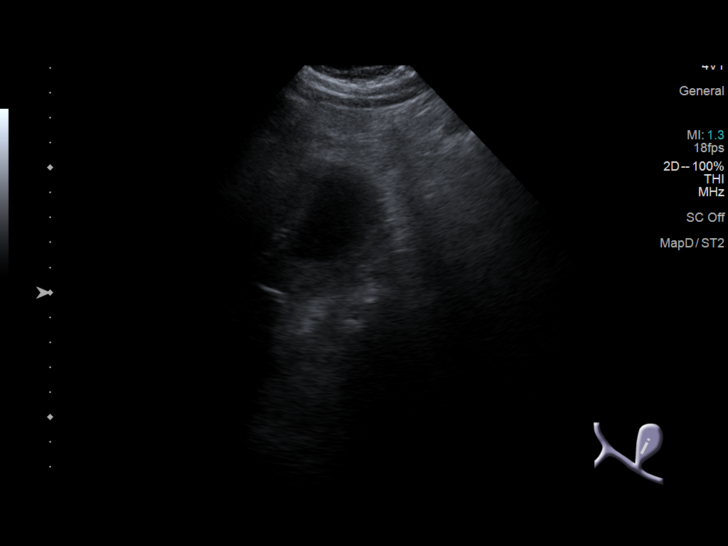
[im 9/108]
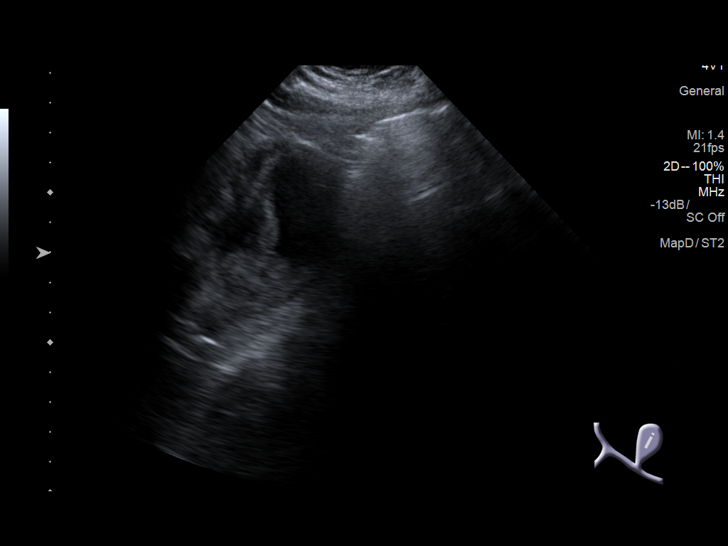
[im 18/108]
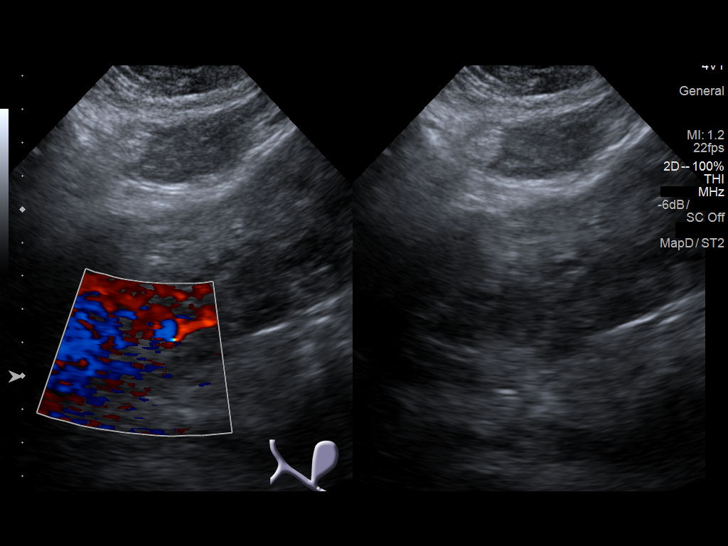
[im 27/108]
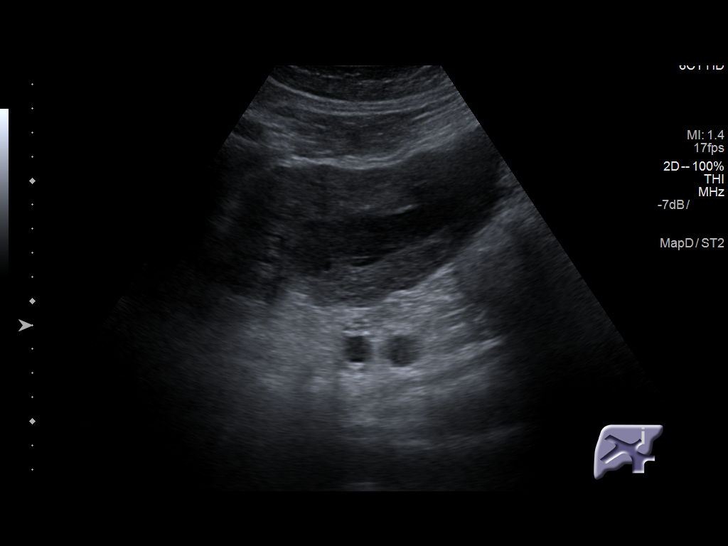
[im 36/108]
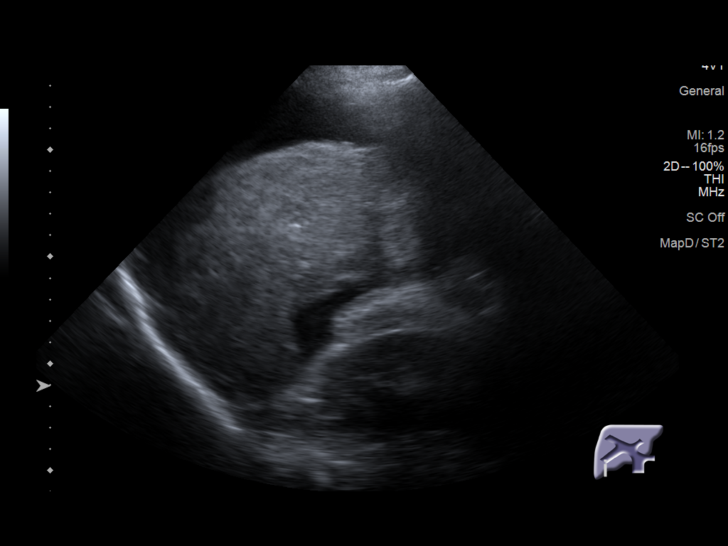
[im 45/108]
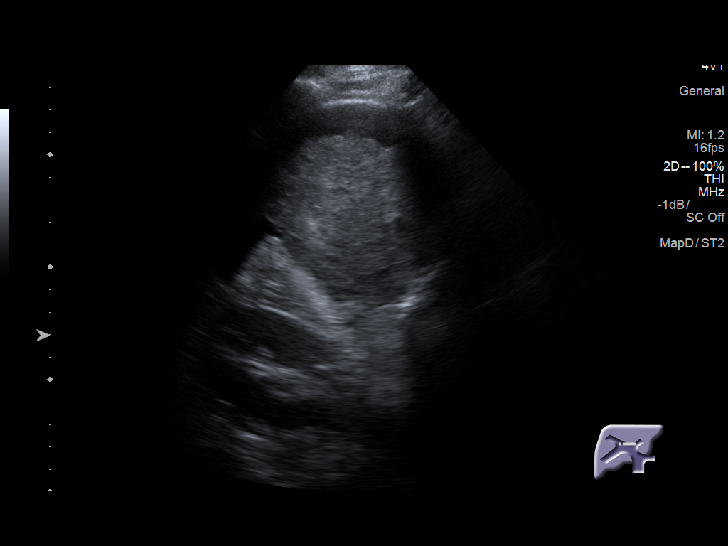
[im 54/108]
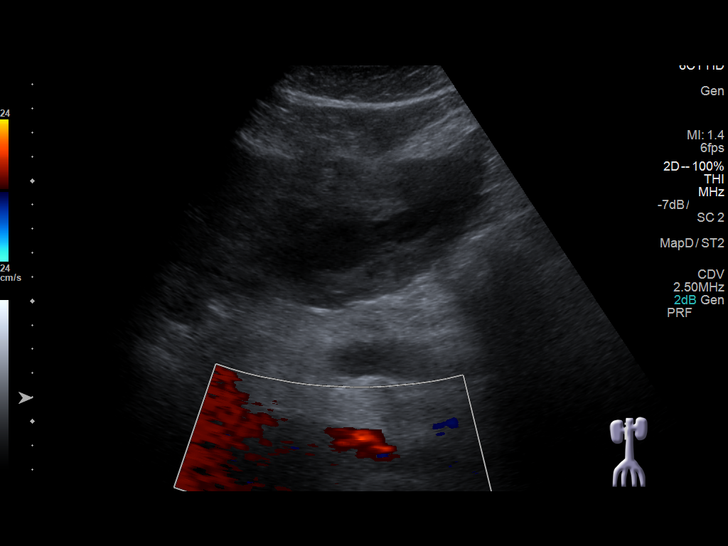
[im 63/108]
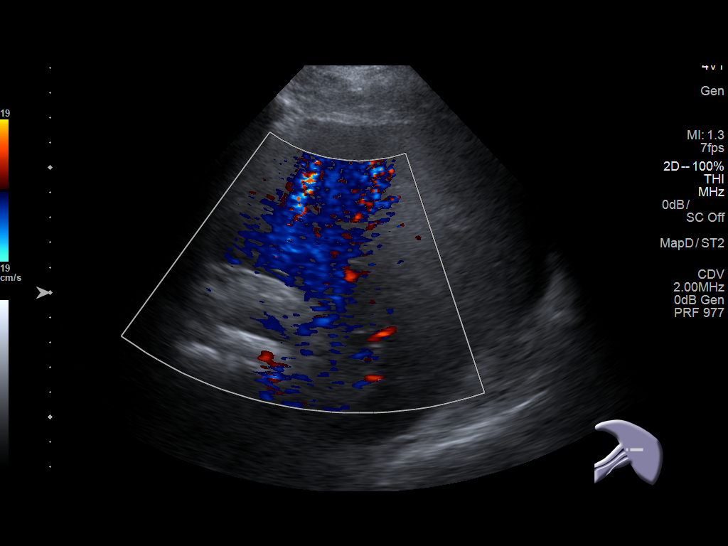
[im 72/108]
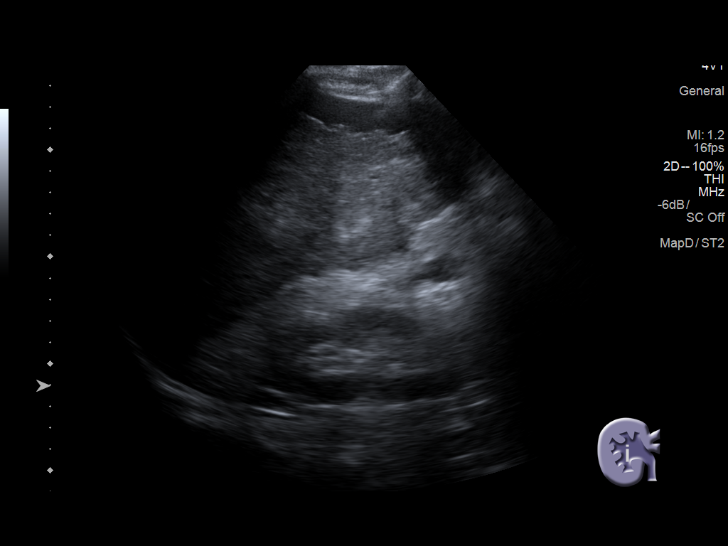
[im 81/108]
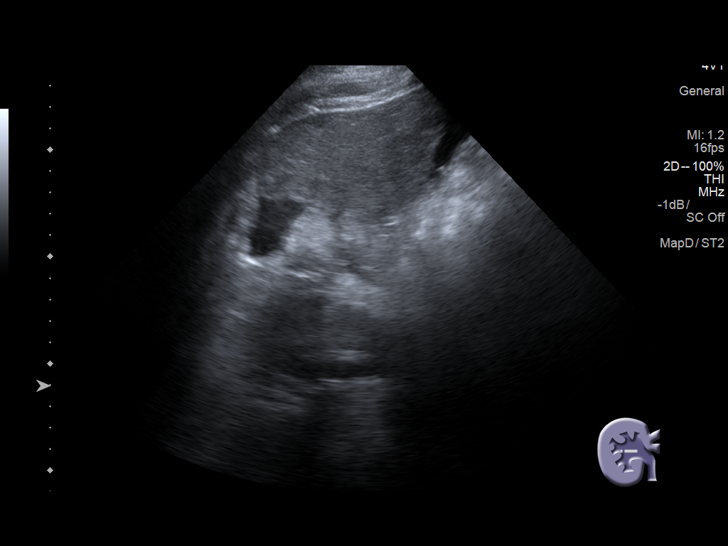
[im 90/108]
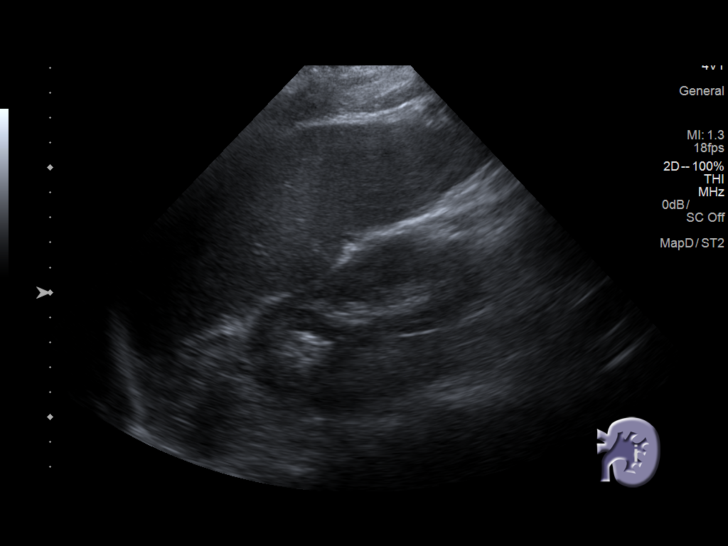
[im 99/108]
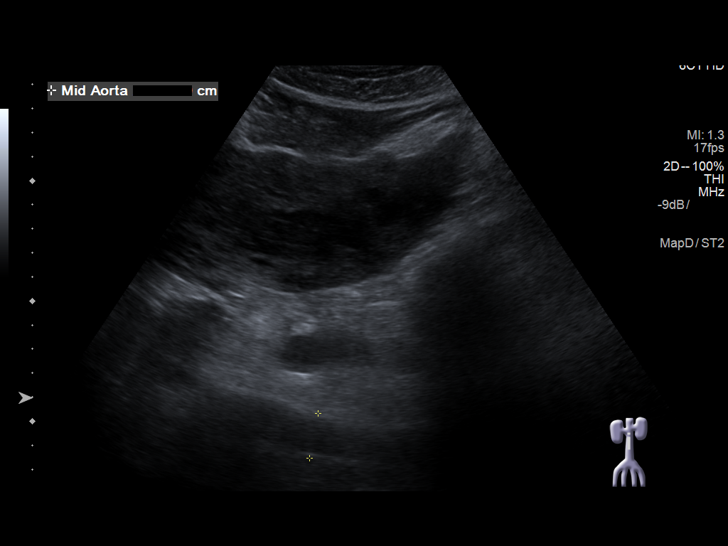
[im 108/108]
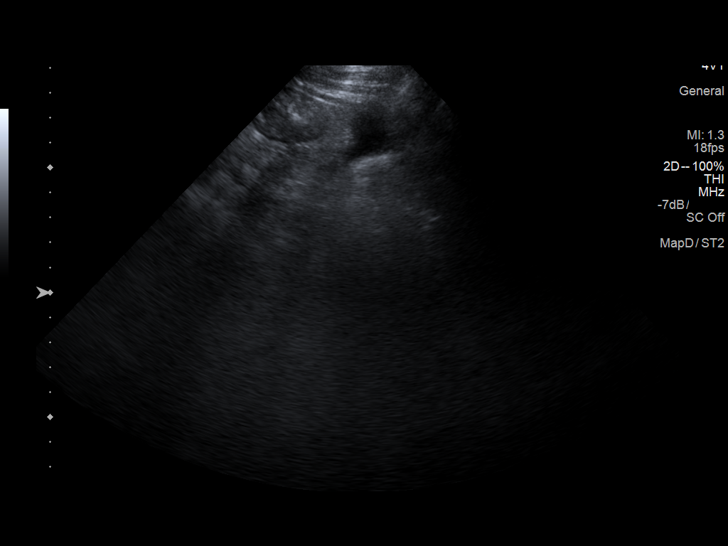

[13 of 25 positions shown; findings below may reference images not displayed]

FINDINGS: Gallbladder: Gallstones are identified in the gallbladder. The
gallbladder wall is thickened measuring 15 mm. There is
pericholecystic fluid. The sonographer reports no sonographic Murphy
sign.

Common bile duct: Diameter: 3 mm

Liver: There is increased echotexture of the liver with
heterogeneity and nodular contour. No definite focal discrete liver
lesion is noted. Portal vein is patent on color Doppler imaging with
normal direction of blood flow towards the liver.

IVC: No abnormality visualized.

Pancreas: Not well visualized.

Spleen: The spleen is enlarged measuring 16.7 cm

Right Kidney: Length: 11.8 cm. Echogenicity within normal limits. No
mass or hydronephrosis visualized.

Left Kidney: Length: 11.2 cm. Echogenicity within normal limits. No
mass or hydronephrosis visualized.

Abdominal aorta: No aneurysm visualized.

Other findings: Minimal ascites is identified.
IMPRESSION: Gallbladder wall thickening with gallstones and pericholecystic
fluid. The sonographer reports no sonographic Murphy sign. The
findings were seen on prior ultrasound and may be in part due to
adjacent hepatocellular disease. However, cholecystitis cannot be
excluded.

Cirrhosis of liver.  Splenomegaly.

## 2019-03-07 IMAGING — DX DG RIBS W/ CHEST 3+V*R*
6 series · 6 of 6 positions shown · non-contrast
Comparison: 01/05/2016

CLINICAL DATA: Cough with right upper quadrant pain

EXAM:
RIGHT RIBS AND CHEST - 3+ VIEW

[rib pa]
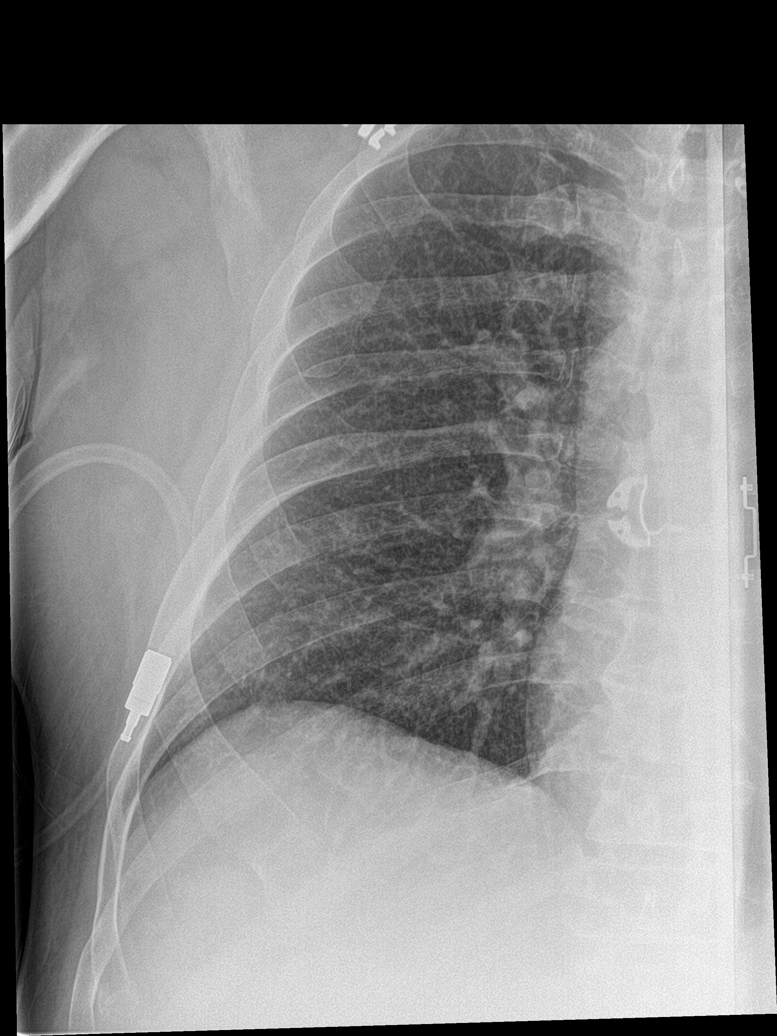

[rib pa obl (1 of 2)]
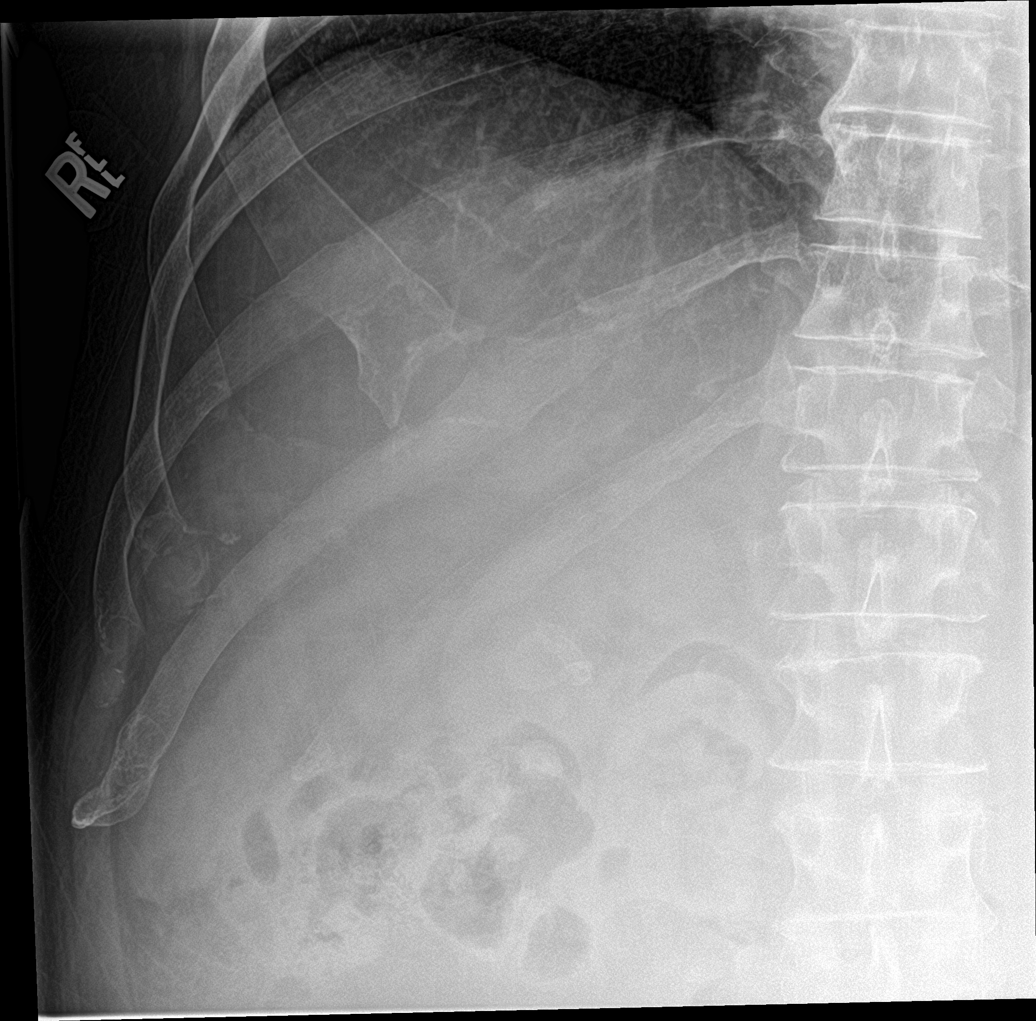

[rib pa obl (2 of 2)]
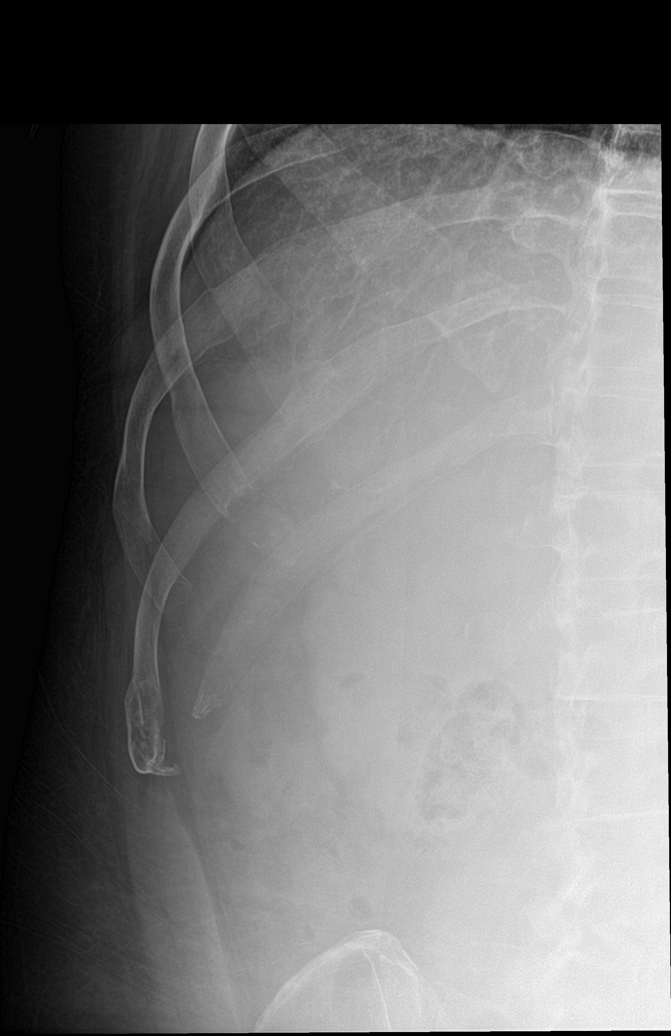

[chest ap (1 of 3)]
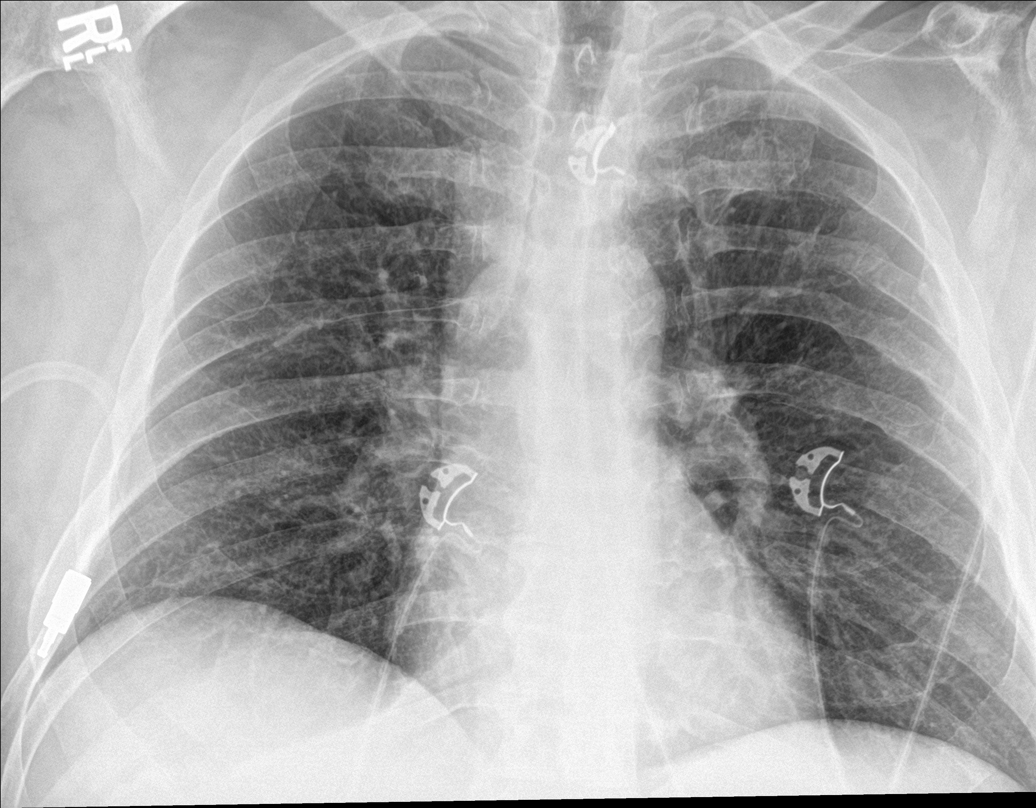

[chest ap (2 of 3)]
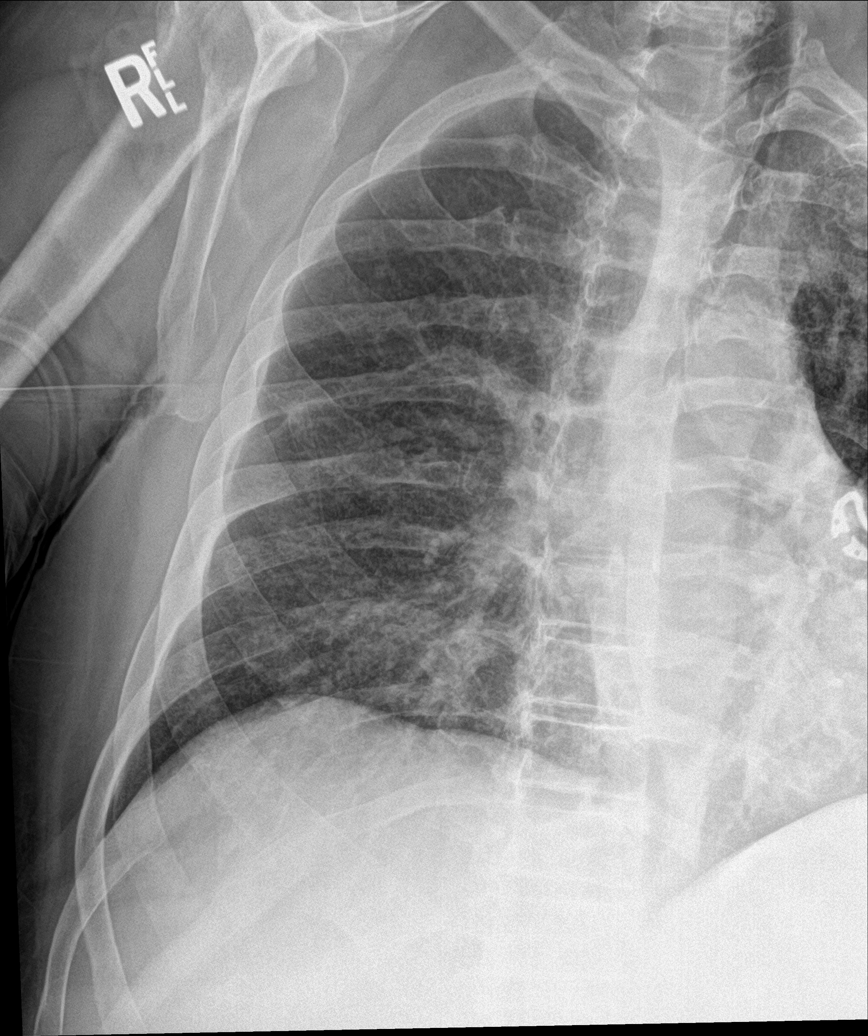

[chest ap (3 of 3)]
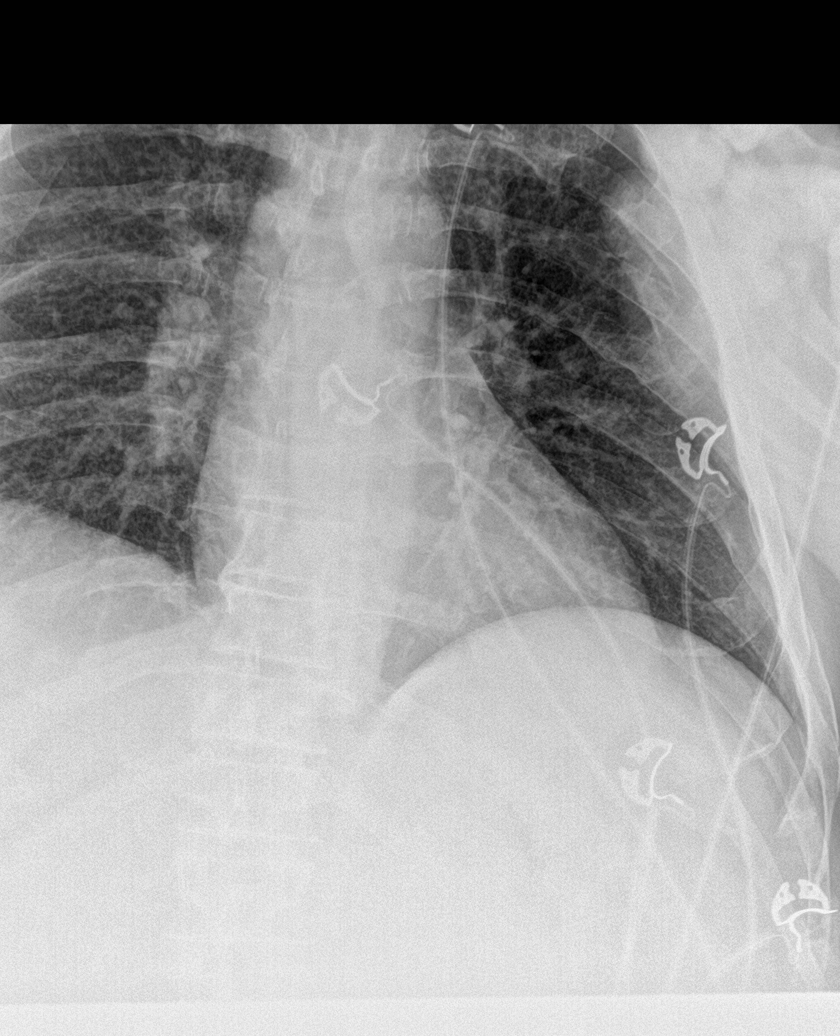

[6 of 6 positions shown; findings below may reference images not displayed]

FINDINGS: Single-view chest demonstrates no acute consolidation or effusion.
Normal heart size. Aortic atherosclerosis. No pneumothorax.

Right rib series demonstrates no definite acute displaced right rib
fracture. Old appearing right tenth rib fracture.
IMPRESSION: 1. Negative for pneumothorax or pleural effusion
2. No definite acute displaced right rib fracture
